# Patient Record
Sex: Male | Born: 1945 | Race: White | Hispanic: No | Marital: Married | Smoking: Never smoker
Health system: Southern US, Community
[De-identification: ages and names within clinical notes are randomized; demographics above are authoritative.]

## PROBLEM LIST (undated history)

## (undated) DIAGNOSIS — H35039 Hypertensive retinopathy, unspecified eye: Secondary | ICD-10-CM

## (undated) DIAGNOSIS — R7303 Prediabetes: Secondary | ICD-10-CM

## (undated) DIAGNOSIS — I639 Cerebral infarction, unspecified: Secondary | ICD-10-CM

## (undated) DIAGNOSIS — I10 Essential (primary) hypertension: Secondary | ICD-10-CM

## (undated) DIAGNOSIS — C61 Malignant neoplasm of prostate: Secondary | ICD-10-CM

## (undated) DIAGNOSIS — E7800 Pure hypercholesterolemia, unspecified: Secondary | ICD-10-CM

## (undated) DIAGNOSIS — I6529 Occlusion and stenosis of unspecified carotid artery: Secondary | ICD-10-CM

## (undated) HISTORY — DX: Hypertensive retinopathy, unspecified eye: H35.039

## (undated) HISTORY — PX: CAROTID ENDARTERECTOMY: SUR193

## (undated) HISTORY — PX: EYE SURGERY: SHX253

## (undated) HISTORY — PX: CATARACT EXTRACTION: SUR2

## (undated) HISTORY — DX: Occlusion and stenosis of unspecified carotid artery: I65.29

## (undated) HISTORY — PX: CHOLECYSTECTOMY: SHX55

---

## 2011-01-30 DIAGNOSIS — E785 Hyperlipidemia, unspecified: Secondary | ICD-10-CM | POA: Diagnosis not present

## 2011-01-30 DIAGNOSIS — Z125 Encounter for screening for malignant neoplasm of prostate: Secondary | ICD-10-CM | POA: Diagnosis not present

## 2011-02-01 DIAGNOSIS — E785 Hyperlipidemia, unspecified: Secondary | ICD-10-CM | POA: Diagnosis not present

## 2011-02-01 DIAGNOSIS — Z0000 Encounter for general adult medical examination without abnormal findings: Secondary | ICD-10-CM | POA: Diagnosis not present

## 2011-02-13 DIAGNOSIS — J042 Acute laryngotracheitis: Secondary | ICD-10-CM | POA: Diagnosis not present

## 2011-02-20 DIAGNOSIS — J309 Allergic rhinitis, unspecified: Secondary | ICD-10-CM | POA: Diagnosis not present

## 2011-04-19 DIAGNOSIS — R509 Fever, unspecified: Secondary | ICD-10-CM | POA: Diagnosis not present

## 2011-04-19 DIAGNOSIS — R05 Cough: Secondary | ICD-10-CM | POA: Diagnosis not present

## 2011-04-19 DIAGNOSIS — R059 Cough, unspecified: Secondary | ICD-10-CM | POA: Diagnosis not present

## 2011-04-19 DIAGNOSIS — J209 Acute bronchitis, unspecified: Secondary | ICD-10-CM | POA: Diagnosis not present

## 2011-04-27 DIAGNOSIS — J209 Acute bronchitis, unspecified: Secondary | ICD-10-CM | POA: Diagnosis not present

## 2011-04-27 DIAGNOSIS — J019 Acute sinusitis, unspecified: Secondary | ICD-10-CM | POA: Diagnosis not present

## 2011-07-19 DIAGNOSIS — E785 Hyperlipidemia, unspecified: Secondary | ICD-10-CM | POA: Diagnosis not present

## 2011-10-24 DIAGNOSIS — Z23 Encounter for immunization: Secondary | ICD-10-CM | POA: Diagnosis not present

## 2011-11-28 DIAGNOSIS — Z09 Encounter for follow-up examination after completed treatment for conditions other than malignant neoplasm: Secondary | ICD-10-CM | POA: Diagnosis not present

## 2012-01-05 DIAGNOSIS — Z125 Encounter for screening for malignant neoplasm of prostate: Secondary | ICD-10-CM | POA: Diagnosis not present

## 2012-01-05 DIAGNOSIS — I798 Other disorders of arteries, arterioles and capillaries in diseases classified elsewhere: Secondary | ICD-10-CM | POA: Diagnosis not present

## 2012-01-05 DIAGNOSIS — E785 Hyperlipidemia, unspecified: Secondary | ICD-10-CM | POA: Diagnosis not present

## 2012-06-19 DIAGNOSIS — I10 Essential (primary) hypertension: Secondary | ICD-10-CM | POA: Diagnosis not present

## 2012-06-19 DIAGNOSIS — E785 Hyperlipidemia, unspecified: Secondary | ICD-10-CM | POA: Diagnosis not present

## 2012-07-09 DIAGNOSIS — I6529 Occlusion and stenosis of unspecified carotid artery: Secondary | ICD-10-CM | POA: Diagnosis not present

## 2012-07-09 DIAGNOSIS — I63239 Cerebral infarction due to unspecified occlusion or stenosis of unspecified carotid arteries: Secondary | ICD-10-CM | POA: Diagnosis not present

## 2012-09-28 DIAGNOSIS — Z23 Encounter for immunization: Secondary | ICD-10-CM | POA: Diagnosis not present

## 2012-11-27 ENCOUNTER — Inpatient Hospital Stay (HOSPITAL_COMMUNITY)
Admission: EM | Admit: 2012-11-27 | Discharge: 2012-11-29 | DRG: 641 | Disposition: A | Discharge status: Home / Self Care | Payer: Medicare Other | Attending: Internal Medicine | Admitting: Internal Medicine

## 2012-11-27 ENCOUNTER — Emergency Department (HOSPITAL_COMMUNITY): Payer: Medicare Other

## 2012-11-27 ENCOUNTER — Encounter (HOSPITAL_COMMUNITY): Payer: Self-pay | Admitting: Emergency Medicine

## 2012-11-27 DIAGNOSIS — Z8673 Personal history of transient ischemic attack (TIA), and cerebral infarction without residual deficits: Secondary | ICD-10-CM

## 2012-11-27 DIAGNOSIS — F1020 Alcohol dependence, uncomplicated: Secondary | ICD-10-CM | POA: Diagnosis present

## 2012-11-27 DIAGNOSIS — I639 Cerebral infarction, unspecified: Secondary | ICD-10-CM | POA: Diagnosis present

## 2012-11-27 DIAGNOSIS — R030 Elevated blood-pressure reading, without diagnosis of hypertension: Secondary | ICD-10-CM | POA: Diagnosis not present

## 2012-11-27 DIAGNOSIS — Z79899 Other long term (current) drug therapy: Secondary | ICD-10-CM

## 2012-11-27 DIAGNOSIS — I10 Essential (primary) hypertension: Secondary | ICD-10-CM | POA: Diagnosis present

## 2012-11-27 DIAGNOSIS — E878 Other disorders of electrolyte and fluid balance, not elsewhere classified: Secondary | ICD-10-CM | POA: Diagnosis not present

## 2012-11-27 DIAGNOSIS — Z7982 Long term (current) use of aspirin: Secondary | ICD-10-CM | POA: Diagnosis not present

## 2012-11-27 DIAGNOSIS — R262 Difficulty in walking, not elsewhere classified: Secondary | ICD-10-CM | POA: Diagnosis present

## 2012-11-27 DIAGNOSIS — R42 Dizziness and giddiness: Secondary | ICD-10-CM | POA: Diagnosis not present

## 2012-11-27 DIAGNOSIS — IMO0001 Reserved for inherently not codable concepts without codable children: Secondary | ICD-10-CM | POA: Diagnosis present

## 2012-11-27 DIAGNOSIS — G459 Transient cerebral ischemic attack, unspecified: Secondary | ICD-10-CM | POA: Diagnosis not present

## 2012-11-27 DIAGNOSIS — Z7289 Other problems related to lifestyle: Secondary | ICD-10-CM

## 2012-11-27 DIAGNOSIS — I669 Occlusion and stenosis of unspecified cerebral artery: Secondary | ICD-10-CM | POA: Diagnosis not present

## 2012-11-27 DIAGNOSIS — E785 Hyperlipidemia, unspecified: Secondary | ICD-10-CM

## 2012-11-27 HISTORY — DX: Cerebral infarction, unspecified: I63.9

## 2012-11-27 HISTORY — DX: Pure hypercholesterolemia, unspecified: E78.00

## 2012-11-27 LAB — BASIC METABOLIC PANEL
BUN: 16 mg/dL (ref 6–23)
CO2: 23 mEq/L (ref 19–32)
Calcium: 9.3 mg/dL (ref 8.4–10.5)
Chloride: 105 mEq/L (ref 96–112)
Creatinine, Ser: 0.82 mg/dL (ref 0.50–1.35)
GFR calc Af Amer: >90 mL/min (ref >90)
GFR calc non Af Amer: 89 mL/min — ABNORMAL LOW (ref >90)
Glucose, Bld: 135 mg/dL — ABNORMAL HIGH (ref 70–99)
Potassium: 4 mEq/L (ref 3.5–5.1)
Sodium: 141 mEq/L (ref 135–145)

## 2012-11-27 LAB — CBC
HCT: 47.5 % (ref 39.0–52.0)
Hemoglobin: 17.1 g/dL — ABNORMAL HIGH (ref 13.0–17.0)
MCH: 32.9 pg (ref 26.0–34.0)
MCHC: 36 g/dL (ref 30.0–36.0)
MCV: 91.5 fL (ref 78.0–100.0)
Platelets: 137 10*3/uL — ABNORMAL LOW (ref 150–400)
RBC: 5.19 MIL/uL (ref 4.22–5.81)
RDW: 12.7 % (ref 11.5–15.5)
WBC: 6.2 10*3/uL (ref 4.0–10.5)

## 2012-11-27 LAB — GLUCOSE, CAPILLARY: Glucose-Capillary: 131 mg/dL — ABNORMAL HIGH (ref 70–99)

## 2012-11-27 LAB — POCT I-STAT TROPONIN I: Troponin i, poc: 0 ng/mL (ref 0.00–0.08)

## 2012-11-27 MED ORDER — LORATADINE 10 MG PO TABS
10.0000 mg | ORAL_TABLET | Freq: Every day | ORAL | Status: DC
  Administered 2012-11-28 – 2012-11-29 (×2): 10 mg via ORAL
  Filled 2012-11-27 (×2): qty 1

## 2012-11-27 MED ORDER — LORAZEPAM 1 MG PO TABS
0.0000 mg | ORAL_TABLET | Freq: Four times a day (QID) | ORAL | Status: DC
  Administered 2012-11-28: 4 mg via ORAL
  Filled 2012-11-27: qty 4

## 2012-11-27 MED ORDER — HYDRALAZINE HCL 20 MG/ML IJ SOLN: 10.0000 mg | INTRAMUSCULAR | Status: DC | PRN

## 2012-11-27 MED ORDER — SIMVASTATIN 20 MG PO TABS
20.0000 mg | ORAL_TABLET | Freq: Every day | ORAL | Status: DC
  Administered 2012-11-28 (×2): 20 mg via ORAL
  Filled 2012-11-27 (×4): qty 1

## 2012-11-27 MED ORDER — LORAZEPAM 1 MG PO TABS: 0.0000 mg | ORAL_TABLET | Freq: Two times a day (BID) | ORAL | Status: DC

## 2012-11-27 MED ORDER — LORAZEPAM 1 MG PO TABS: 1.0000 mg | ORAL_TABLET | Freq: Four times a day (QID) | ORAL | Status: DC | PRN

## 2012-11-27 MED ORDER — ADULT MULTIVITAMIN W/MINERALS CH
1.0000 | ORAL_TABLET | Freq: Every day | ORAL | Status: DC
  Administered 2012-11-28 – 2012-11-29 (×2): 1 via ORAL
  Filled 2012-11-27 (×2): qty 1

## 2012-11-27 MED ORDER — ASPIRIN EC 325 MG PO TBEC
325.0000 mg | DELAYED_RELEASE_TABLET | Freq: Every day | ORAL | Status: DC
  Administered 2012-11-28 – 2012-11-29 (×2): 325 mg via ORAL
  Filled 2012-11-27 (×2): qty 1

## 2012-11-27 MED ORDER — LORAZEPAM 2 MG/ML IJ SOLN: 1.0000 mg | Freq: Four times a day (QID) | INTRAMUSCULAR | Status: DC | PRN

## 2012-11-27 MED ORDER — ASPIRIN 300 MG RE SUPP
300.0000 mg | Freq: Every day | RECTAL | Status: DC
  Filled 2012-11-27 (×2): qty 1

## 2012-11-27 MED ORDER — THIAMINE HCL 100 MG/ML IJ SOLN
100.0000 mg | Freq: Every day | INTRAMUSCULAR | Status: DC
  Filled 2012-11-27 (×2): qty 1

## 2012-11-27 MED ORDER — ASPIRIN 325 MG PO TABS: 325.0000 mg | ORAL_TABLET | Freq: Every day | ORAL | Status: DC

## 2012-11-27 MED ORDER — VITAMIN B-1 100 MG PO TABS
100.0000 mg | ORAL_TABLET | Freq: Every day | ORAL | Status: DC
  Administered 2012-11-28 – 2012-11-29 (×2): 100 mg via ORAL
  Filled 2012-11-27 (×2): qty 1

## 2012-11-27 MED ORDER — SENNOSIDES-DOCUSATE SODIUM 8.6-50 MG PO TABS: 1.0000 | ORAL_TABLET | Freq: Every evening | ORAL | Status: DC | PRN

## 2012-11-27 MED ORDER — ENOXAPARIN SODIUM 40 MG/0.4ML ~~LOC~~ SOLN
40.0000 mg | SUBCUTANEOUS | Status: DC
  Administered 2012-11-28 (×2): 40 mg via SUBCUTANEOUS
  Filled 2012-11-27 (×3): qty 0.4

## 2012-11-27 MED ORDER — FOLIC ACID 1 MG PO TABS
1.0000 mg | ORAL_TABLET | Freq: Every day | ORAL | Status: DC
  Administered 2012-11-28 – 2012-11-29 (×2): 1 mg via ORAL
  Filled 2012-11-27 (×2): qty 1

## 2012-11-27 NOTE — H&P (Signed)
[facility] History and Physical  Jesse Goodwin ZOX:[id number] DOB: 1945-07-23 DOA: 11/27/2012  Referring physician: ER physician. PCP: No PCP Per Patient patient recently moved to [place].  Chief Complaint: Disequilibrium.  HPI: Jesse Goodwin is a 67 y.o. male with known history of stroke and hyperlipidemia status post right-sided carotid endarterectomy has been experiencing some difficulty walking since morning due to balance issues. Later he went to sleep and woke up afternoon after which he still had some difficulty walking and had come to the ER. By the time patient reached ER his symptoms have largely resolved and CT head did not show any acute but did show age-indeterminate frontal lobe lateral infarct and patient has been made for further management. Patient denies any weakness of the upper extremities denies any blurred vision difficulty speaking or swallowing. Denies any chest pain or shortness of breath.  Review of Systems: As presented in the history of presenting illness, rest negative.  Past Medical History  Diagnosis Date  . Stroke   . Hypercholesteremia    Past Surgical History  Procedure Laterality Date  . Carotid endarterectomy    . Cholecystectomy     Social History:  reports that he has never smoked. He does not have any smokeless tobacco history on file. He reports that he drinks alcohol. He reports that he does not use illicit drugs. Where does patient live home. Can patient participate in ADLs? Yes.  No Known Allergies  Family History:  Family History  Problem Relation Age of Onset  . Stroke Mother   . Breast cancer Mother   . Dementia Mother   . CAD Mother   . Diabetes Mellitus II Mother       Prior to Admission medications   Medication Sig Start Date End Date Taking? Authorizing Provider  aspirin 325 MG EC tablet Take 325 mg by mouth daily.   Yes Historical Provider, MD  cetirizine (ZYRTEC) 10 MG tablet Take 10 mg by mouth daily.   Yes  Historical Provider, MD  simvastatin (ZOCOR) 20 MG tablet Take 20 mg by mouth at bedtime.   Yes Historical Provider, MD  Tetrahydrozoline HCl (VISINE OP) Place 2 drops into both eyes daily as needed (dry eye).   Yes Historical Provider, MD    Physical Exam: Filed Vitals:   11/27/12 1900 11/27/12 1915 11/27/12 1945 11/27/12 2011  BP: 178/75 179/104 170/85 156/83  Pulse: 54 55 54 54  Temp:      TempSrc:      Resp: 13 12  20   SpO2: 99% 99% 100% 99%     General:  Well-developed well-nourished.  Eyes: Anicteric no pallor.  ENT: No discharge from the ears eyes nose mouth.  Neck: No mass felt.  Cardiovascular: S1-S2 heard.  Respiratory: No rhonchi or crepitations.  Abdomen: Soft nontender bowel sounds present.  Skin: No rash.  Musculoskeletal: No edema.  Psychiatric: Appears normal.  Neurologic: Alert awake oriented to time place and person. Moves all extremities 5 x 5. No facial asymmetry. Tongue is midline.  Labs on Admission:  Basic Metabolic Panel:  Recent Labs Lab 11/27/12 1545  NA 141  K 4.0  CL 105  CO2 23  GLUCOSE 135*  BUN 16  CREATININE 0.82  CALCIUM 9.3   Liver Function Tests: No results found for this basename: AST, ALT, ALKPHOS, BILITOT, PROT, ALBUMIN,  in the last 168 hours No results found for this basename: LIPASE, AMYLASE,  in the last 168 hours No results found for this basename: AMMONIA,  in the last 168 hours CBC:  Recent Labs Lab 11/27/12 1545  WBC 6.2  HGB 17.1*  HCT 47.5  MCV 91.5  PLT 137*   Cardiac Enzymes: No results found for this basename: CKTOTAL, CKMB, CKMBINDEX, TROPONINI,  in the last 168 hours  BNP (last 3 results) No results found for this basename: PROBNP,  in the last 8760 hours CBG:  Recent Labs Lab 11/27/12 1544  GLUCAP 131*    Radiological Exams on Admission: Ct Head Wo Contrast  11/27/2012   CLINICAL DATA:  Dizziness worse with standing and lightheadedness beginning this morning. History of stroke.   EXAM: CT HEAD WITHOUT CONTRAST  TECHNIQUE: Contiguous axial images were obtained from the base of the skull through the vertex without intravenous contrast.  COMPARISON:  None.  FINDINGS: The ventricles are prominent for age, suggestive of mildly advanced cerebral atrophy. There is a 1 cm round focus of hypoattenuation within the right frontal white matter (series 2, image 20). Periventricular white matter hypoattenuation is suggestive of mild chronic small vessel ischemic disease. There is no evidence of acute cortical infarct, mass, midline shift, intracranial hemorrhage, or extra-axial collection. The orbits are unremarkable. The mastoid air cells are clear. There is partial opacification of multiple ethmoid air cells bilaterally. Mucosal thickening is partially visualized in the alveolar recess of the left maxillary sinus.  IMPRESSION: 1. Small low-density focus in the right frontal white matter, nonspecific but may represent a lacunar infarct of indeterminate age. 2. No evidence of acute cortical infarct or intracranial hemorrhage.   Electronically Signed   By: Sebastian Ache   On: 11/27/2012 20:17    EKG: Independently reviewed. Normal sinus rhythm.  Assessment/Plan Principal Problem:   CVA (cerebral infarction) Active Problems:   Elevated blood pressure   Hyperlipidemia   1. Possible CVA - at this time neurologist has recommended to get MRI brain and if positive to pursue further stroke workup. Continue aspirin. 2. Elevated blood pressure - closely follow blood pressure trends. Since patient may have acute stroke at this time permissive hypertension. Eventually patient may need antihypertensives if patient is having consistently high blood pressure. Patient presently is on when necessary IV hydralazine for systolic blood pressure more than 220 and diastolic more than 120. 3. Hyperlipidemia - continue statins.    Code Status: Full code.  Family Communication: Family at the bedside.   Disposition Plan: Admit to inpatient.    KAKRAKANDY,ARSHAD N. [facility] Pager [phone].  If 7PM-7AM, please contact night-coverage [email or web address] Password [facility] 11/27/2012, 9:29 PM

## 2012-11-27 NOTE — Progress Notes (Signed)
Received pt from ED via stretcher. Pt alert, oriented and not in any acute distress. Assessed pt. Vital signs checked and recorded. Will continue to monitor pt.

## 2012-11-27 NOTE — ED Notes (Signed)
Pt verbalizes he woke up this morning and felt dizzy and lethargic. Pt verbalizes he still feels dizzy and took his BP at home and verbalizes it was elevated. Pt denies Hx of HTN.

## 2012-11-27 NOTE — Progress Notes (Signed)
Receiving report from ED. Report given by Jesusita Oka, RN.

## 2012-11-27 NOTE — ED Notes (Signed)
CBG is 131. Notified Nurse Shanda Bumps.

## 2012-11-27 NOTE — ED Provider Notes (Signed)
CSN: [id number]     Arrival date & time 11/27/12  1526 History   First MD Initiated Contact with Patient 11/27/12 1847     Chief Complaint  Patient presents with  . Dizziness  . Hypertension    The history is provided by the patient and a relative.  pt presents for dizziness He reports that after waking this morning, he felt "off" and felt dizzy with some difficulty walking, improved with rest No HA No focal weakness No visual/hearing loss No cp/sob No abd pain  He is now feeling improved but with some residual symptoms He has h/o CVA He has h/o HTN   H/o right carotid surgery previously Denies recent CVA workup   Past Medical History  Diagnosis Date  . Stroke   . Hypercholesteremia    History reviewed. No pertinent past surgical history. History reviewed. No pertinent family history. History  Substance Use Topics  . Smoking status: Never Smoker   . Smokeless tobacco: Not on file  . Alcohol Use: Yes    Review of Systems  Constitutional: Negative for fever.  Respiratory: Negative for shortness of breath.   Cardiovascular: Negative for chest pain.  Neurological: Positive for dizziness.  All other systems reviewed and are negative.    Allergies  Review of patient's allergies indicates no known allergies.  Home Medications   Current Outpatient Rx  Name  Route  Sig  Dispense  Refill  . aspirin 325 MG EC tablet   Oral   Take 325 mg by mouth daily.         . cetirizine (ZYRTEC) 10 MG tablet   Oral   Take 10 mg by mouth daily.         . simvastatin (ZOCOR) 20 MG tablet   Oral   Take 20 mg by mouth at bedtime.         . Tetrahydrozoline HCl (VISINE OP)   Both Eyes   Place 2 drops into both eyes daily as needed (dry eye).          BP 156/83  Pulse 54  Temp(Src) 97.8 F (36.6 C) (Oral)  Resp 20  SpO2 99% Physical Exam CONSTITUTIONAL: Well developed/well nourished HEAD: Normocephalic/atraumatic EYES: EOMI/PERRL ENMT: Mucous membranes  moist NECK: supple no meningeal signs, scar noted to right neck, no bruits SPINE:entire spine nontender CV: S1/S2 noted, no murmurs/rubs/gallops noted LUNGS: Lungs are clear to auscultation bilaterally, no apparent distress ABDOMEN: soft, nontender, no rebound or guarding GU:no cva tenderness NEURO: Pt is awake/alert, moves all extremitiesx4 No arm/leg drift.  No facial droop is noted.  No past pointing No ataxia noted EXTREMITIES: pulses normal, full ROM SKIN: warm, color normal PSYCH: no abnormalities of mood noted  ED Course  Procedures   tPA in stroke considered but not given due to:  Onset over 3-4.5hours Symptoms improving  8:42 PM D/w dr Toniann Fail, will admit for TIA workup    Labs Review Labs Reviewed  CBC - Abnormal; Notable for the following:    Hemoglobin 17.1 (*)    Platelets 137 (*)    All other components within normal limits  BASIC METABOLIC PANEL - Abnormal; Notable for the following:    Glucose, Bld 135 (*)    GFR calc non Af Amer 89 (*)    All other components within normal limits  GLUCOSE, CAPILLARY - Abnormal; Notable for the following:    Glucose-Capillary 131 (*)    All other components within normal limits  POCT I-STAT TROPONIN I  Imaging Review Ct Head Wo Contrast  11/27/2012   CLINICAL DATA:  Dizziness worse with standing and lightheadedness beginning this morning. History of stroke.  EXAM: CT HEAD WITHOUT CONTRAST  TECHNIQUE: Contiguous axial images were obtained from the base of the skull through the vertex without intravenous contrast.  COMPARISON:  None.  FINDINGS: The ventricles are prominent for age, suggestive of mildly advanced cerebral atrophy. There is a 1 cm round focus of hypoattenuation within the right frontal white matter (series 2, image 20). Periventricular white matter hypoattenuation is suggestive of mild chronic small vessel ischemic disease. There is no evidence of acute cortical infarct, mass, midline shift, intracranial  hemorrhage, or extra-axial collection. The orbits are unremarkable. The mastoid air cells are clear. There is partial opacification of multiple ethmoid air cells bilaterally. Mucosal thickening is partially visualized in the alveolar recess of the left maxillary sinus.  IMPRESSION: 1. Small low-density focus in the right frontal white matter, nonspecific but may represent a lacunar infarct of indeterminate age. 2. No evidence of acute cortical infarct or intracranial hemorrhage.   Electronically Signed   By: Sebastian Ache   On: 11/27/2012 20:17    EKG Interpretation     Ventricular Rate:  65 PR Interval:  164 QRS Duration: 86 QT Interval:  436 QTC Calculation: 453 R Axis:   16 Text Interpretation:  Normal sinus rhythm Possible Left atrial enlargement Borderline ECG No previous ECGs available            MDM  No diagnosis found. Nursing notes including past medical history and social history reviewed and considered in documentation Labs/vital reviewed and considered     Joya Gaskins, MD 11/27/12 2043

## 2012-11-27 NOTE — ED Notes (Signed)
MD at bedside. 

## 2012-11-27 NOTE — ED Notes (Signed)
Patient transported to CT 

## 2012-11-27 NOTE — ED Notes (Signed)
Pt c/o upon waking this am some dizziness worse with standing and lightheadedness; pt sts noted htn without hx of same; pt sts hx of CVA in past

## 2012-11-28 ENCOUNTER — Inpatient Hospital Stay (HOSPITAL_COMMUNITY): Payer: Medicare Other

## 2012-11-28 DIAGNOSIS — I10 Essential (primary) hypertension: Secondary | ICD-10-CM | POA: Diagnosis present

## 2012-11-28 DIAGNOSIS — R030 Elevated blood-pressure reading, without diagnosis of hypertension: Secondary | ICD-10-CM

## 2012-11-28 DIAGNOSIS — I6350 Cerebral infarction due to unspecified occlusion or stenosis of unspecified cerebral artery: Secondary | ICD-10-CM

## 2012-11-28 DIAGNOSIS — F1010 Alcohol abuse, uncomplicated: Secondary | ICD-10-CM

## 2012-11-28 DIAGNOSIS — E785 Hyperlipidemia, unspecified: Secondary | ICD-10-CM

## 2012-11-28 LAB — CBC WITH DIFFERENTIAL/PLATELET
Basophils Absolute: 0 10*3/uL (ref 0.0–0.1)
Basophils Relative: 1 % (ref 0–1)
Eosinophils Absolute: 0.3 10*3/uL (ref 0.0–0.7)
Eosinophils Relative: 4 % (ref 0–5)
HCT: 41.5 % (ref 39.0–52.0)
Hemoglobin: 14.6 g/dL (ref 13.0–17.0)
Lymphocytes Relative: 29 % (ref 12–46)
Lymphs Abs: 1.7 10*3/uL (ref 0.7–4.0)
MCH: 32.2 pg (ref 26.0–34.0)
MCHC: 35.2 g/dL (ref 30.0–36.0)
MCV: 91.4 fL (ref 78.0–100.0)
Monocytes Absolute: 0.8 10*3/uL (ref 0.1–1.0)
Monocytes Relative: 13 % — ABNORMAL HIGH (ref 3–12)
Neutro Abs: 3.1 10*3/uL (ref 1.7–7.7)
Neutrophils Relative %: 53 % (ref 43–77)
Platelets: 162 10*3/uL (ref 150–400)
RBC: 4.54 MIL/uL (ref 4.22–5.81)
RDW: 13.1 % (ref 11.5–15.5)
WBC: 5.9 10*3/uL (ref 4.0–10.5)

## 2012-11-28 LAB — COMPREHENSIVE METABOLIC PANEL
ALT: 39 U/L (ref 0–53)
AST: 23 U/L (ref 0–37)
Albumin: 3.5 g/dL (ref 3.5–5.2)
Alkaline Phosphatase: 41 U/L (ref 39–117)
BUN: 15 mg/dL (ref 6–23)
CO2: 27 mEq/L (ref 19–32)
Calcium: 8.7 mg/dL (ref 8.4–10.5)
Chloride: 104 mEq/L (ref 96–112)
Creatinine, Ser: 0.89 mg/dL (ref 0.50–1.35)
GFR calc Af Amer: >90 mL/min (ref >90)
GFR calc non Af Amer: 87 mL/min — ABNORMAL LOW (ref >90)
Glucose, Bld: 144 mg/dL — ABNORMAL HIGH (ref 70–99)
Potassium: 3.3 mEq/L — ABNORMAL LOW (ref 3.5–5.1)
Sodium: 141 mEq/L (ref 135–145)
Total Bilirubin: 0.3 mg/dL (ref 0.3–1.2)
Total Protein: 6.5 g/dL (ref 6.0–8.3)

## 2012-11-28 LAB — HEMOGLOBIN A1C
Hgb A1c MFr Bld: 5.8 % — ABNORMAL HIGH (ref <5.7)
Mean Plasma Glucose: 120 mg/dL — ABNORMAL HIGH (ref <117)

## 2012-11-28 LAB — LIPID PANEL
Cholesterol: 174 mg/dL (ref 0–200)
HDL: 64 mg/dL (ref >39)
LDL Cholesterol: 85 mg/dL (ref 0–99)
Total CHOL/HDL Ratio: 2.7 RATIO
Triglycerides: 127 mg/dL (ref <150)
VLDL: 25 mg/dL (ref 0–40)

## 2012-11-28 MED ORDER — LISINOPRIL 5 MG PO TABS
5.0000 mg | ORAL_TABLET | Freq: Every day | ORAL | Status: DC
  Administered 2012-11-28 – 2012-11-29 (×2): 5 mg via ORAL
  Filled 2012-11-28 (×2): qty 1

## 2012-11-28 NOTE — Progress Notes (Signed)
CM CONSULT Talked to patient with spouse and daughter present about discharge planning; patient is new to this area and needs a primary care physician; Patient informed CM that he plans to follow up with Dr Azucena Cecil with [facility] after discharge; pharmacy of choice is [facility] and does not have any problems getting his medication/ prescriptions filled; very supportive family; Alexis Goodell [phone]

## 2012-11-28 NOTE — Evaluation (Signed)
Physical Therapy Evaluation Patient Details Name: Jesse Goodwin MRN: [id number] DOB: [birth year] Today's Date: 11/28/2012 Time: 4098-1191 PT Time Calculation (min): 13 min  PT Assessment / Plan / Recommendation History of Present Illness  Jesse Goodwin is a 67 y.o. 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 He describes that initially awakening, he stood up to walk and felt himself to be very unsteady, and therefore laid back down and slept. Later in the day, on awakening, he continued to have disequilibrium and lightheadedness, but denies any true vertigo.  Clinical Impression  Pt is independent with all mobility and does not have any further PT needs at this time.    PT Assessment  Patent does not need any further PT services    Follow Up Recommendations  No PT follow up    Does the patient have the potential to tolerate intense rehabilitation      Barriers to Discharge        Equipment Recommendations  None recommended by PT    Recommendations for Other Services     Frequency      Precautions / Restrictions Precautions Precautions: None Restrictions Weight Bearing Restrictions: No   Pertinent Vitals/Pain No c/o pain      Mobility  Bed Mobility Bed Mobility: Supine to Sit;Sit to Supine Supine to Sit: 7: Independent Sit to Supine: 7: Independent Transfers Transfers: Sit to Stand;Stand to Sit Sit to Stand: 7: Independent Stand to Sit: 7: Independent Ambulation/Gait Ambulation/Gait Assistance: 7: Independent Ambulation Distance (Feet): 400 Feet Assistive device: None Ambulation/Gait Assistance Details: normal cadence and gait pattern Stairs: Yes Stairs Assistance: 7: Independent Stair Management Technique: No rails Number of Stairs: 5    Exercises     PT Diagnosis:    PT Problem List:   PT Treatment Interventions:       PT Goals(Current goals can be found in the care  plan section) Acute Rehab PT Goals PT Goal Formulation: No goals set, d/c therapy  Visit Information  Last PT Received On: 11/28/12 Assistance Needed: +1 History of Present Illness: Jesse Goodwin is a 67 y.o. 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 He describes that initially awakening, he stood up to walk and felt himself to be very unsteady, and therefore laid back down and slept. Later in the day, on awakening, he continued to have disequilibrium and lightheadedness, but denies any true vertigo.       Prior Functioning  Home Living Family/patient expects to be discharged to:: Private residence Living Arrangements: Spouse/significant other Available Help at Discharge: Family Type of Home: House Home Access: Stairs to enter Entrance Stairs-Rails: None Home Layout: One level Home Equipment: None Prior Function Level of Independence: Independent Communication Communication: No difficulties    Cognition  Cognition Arousal/Alertness: Awake/alert Behavior During Therapy: WFL for tasks assessed/performed Overall Cognitive Status: Within Functional Limits for tasks assessed    Extremity/Trunk Assessment Lower Extremity Assessment Lower Extremity Assessment: Overall WFL for tasks assessed Cervical / Trunk Assessment Cervical / Trunk Assessment: Normal   Balance    End of Session PT - End of Session Equipment Utilized During Treatment: Gait belt Activity Tolerance: Patient tolerated treatment well Patient left: in bed;with call bell/phone within reach Nurse Communication: Mobility status  GP Functional Assessment Tool Used: clinical judgement Functional Limitation: Mobility: Walking and moving around Mobility: Walking and Moving Around Current Status (Y7829): 0 percent  impaired, limited or restricted Mobility: Walking and Moving Around Goal Status [phone]): 0 percent impaired, limited or  restricted Mobility: Walking and Moving Around Discharge Status [phone]): 0 percent impaired, limited or restricted   DONAWERTH,KAREN 11/28/2012, 8:43 AM

## 2012-11-28 NOTE — Progress Notes (Signed)
Pt. Family has requested to speak with MD.  MD was paged twice and notified by student RN earlier about it.  MD specified he is busy with three new admissions and has to round on other patients.  MD stated he will be up to see patient and family when he is able to.

## 2012-11-28 NOTE — Progress Notes (Signed)
[facility] PROGRESS NOTE  Jesse Goodwin ZOX:[id number] DOB: Mar 28, 1945 DOA: 11/27/2012 PCP: No PCP Per Patient  Assessment/Plan: 1. Stroke like symptoms - workup negative for new stroke - Given elevated blood pressures we'll place on ACE inhibitor - continue statin  2 hypertension - As mentioned above I. ACE inhibitor - Will continue to monitor blood pressures - Reassess creatinine next a.m.  3 habitual alcohol use - patient reportedly only drinks one to 2 drinks a day and does not report any anxiety if he does not drink daily. Apparently patient was very sleepy on Ativan as such we'll discontinue Ativan  4. Hyperlipidemia - Continue statin   Code Status: full code Family Communication: very lengthy discussion with wife and daughter more than 40 minute Disposition Plan: if blood pressures improved and creatinine were within normal limits may consider discharge 11/29/2012   Consultants:  neurology  Procedures:  MRI of brain  CT of head  Antibiotics:  none  HPI/Subjective: No new complaint. No acute issues overnight  Objective: Filed Vitals:   11/28/12 1800  BP: 137/71  Pulse: 72  Temp: 97.6 F (36.4 C)  Resp: 18    Intake/Output Summary (Last 24 hours) at 11/28/12 1939 Last data filed at 11/28/12 1700  Gross per 24 hour  Intake    360 ml  Output      0 ml  Net    360 ml   Filed Weights   11/27/12 2327  Weight: 86.592 kg (190 lb 14.4 oz)    Exam:   General:  Patient in no acute distress  Cardiovascular: regular rate and rhythm, no murmurs  Respiratory: clear to auscultation bilaterally, no increased work of breathing  Abdomen: soft nontender nondistended  Musculoskeletal: no cyanosis or clubbing  Data Reviewed: Basic Metabolic Panel:  Recent Labs Lab 11/27/12 1545 11/28/12 0040  NA 141 141  K 4.0 3.3*  CL 105 104  CO2 23 27  GLUCOSE 135* 144*  BUN 16 15  CREATININE 0.82 0.89  CALCIUM 9.3 8.7   Liver Function  Tests:  Recent Labs Lab 11/28/12 0040  AST 23  ALT 39  ALKPHOS 41  BILITOT 0.3  PROT 6.5  ALBUMIN 3.5   No results found for this basename: LIPASE, AMYLASE,  in the last 168 hours No results found for this basename: AMMONIA,  in the last 168 hours CBC:  Recent Labs Lab 11/27/12 1545 11/28/12 0040  WBC 6.2 5.9  NEUTROABS  --  3.1  HGB 17.1* 14.6  HCT 47.5 41.5  MCV 91.5 91.4  PLT 137* 162   Cardiac Enzymes: No results found for this basename: CKTOTAL, CKMB, CKMBINDEX, TROPONINI,  in the last 168 hours BNP (last 3 results) No results found for this basename: PROBNP,  in the last 8760 hours CBG:  Recent Labs Lab 11/27/12 1544  GLUCAP 131*    No results found for this or any previous visit (from the past 240 hour(s)).   Studies: Dg Chest 2 View  11/28/2012   CLINICAL DATA:  Stroke.  EXAM: CHEST  2 VIEW  COMPARISON:  None.  FINDINGS: Heart size and pulmonary vascularity are normal and the lungs are clear except for a tiny area of linear atelectasis or scarring low at the right lung base posteriorly. No significant osseous abnormality.  IMPRESSION: No significant abnormality.   Electronically Signed   By: Geanie Cooley M.D.   On: 11/28/2012 13:10   Ct Head Wo Contrast  11/27/2012   CLINICAL DATA:  Dizziness worse  with standing and lightheadedness beginning this morning. History of stroke.  EXAM: CT HEAD WITHOUT CONTRAST  TECHNIQUE: Contiguous axial images were obtained from the base of the skull through the vertex without intravenous contrast.  COMPARISON:  None.  FINDINGS: The ventricles are prominent for age, suggestive of mildly advanced cerebral atrophy. There is a 1 cm round focus of hypoattenuation within the right frontal white matter (series 2, image 20). Periventricular white matter hypoattenuation is suggestive of mild chronic small vessel ischemic disease. There is no evidence of acute cortical infarct, mass, midline shift, intracranial hemorrhage, or extra-axial  collection. The orbits are unremarkable. The mastoid air cells are clear. There is partial opacification of multiple ethmoid air cells bilaterally. Mucosal thickening is partially visualized in the alveolar recess of the left maxillary sinus.  IMPRESSION: 1. Small low-density focus in the right frontal white matter, nonspecific but may represent a lacunar infarct of indeterminate age. 2. No evidence of acute cortical infarct or intracranial hemorrhage.   Electronically Signed   By: Sebastian Ache   On: 11/27/2012 20:17   Mr Brain Wo Contrast  11/28/2012   CLINICAL DATA:  67 year old male with hyperlipidemia and history of prior stroke presenting with difficulty walking and balance issues. Symptoms largely resolved in emergency room. Prior carotid endarterectomy.  EXAM: MRI HEAD WITHOUT CONTRAST  TECHNIQUE: Multiplanar, multiecho pulse sequences of the brain and surrounding structures were obtained without intravenous contrast.  COMPARISON:  11/27/2012 CT.  No comparison MR.  FINDINGS: No acute infarct.  Scattered white matter type changes most notable right frontal lobe most consistent with result of small vessel disease in this hyperlipidemia patient.  No intracranial hemorrhage.  No intracranial mass lesion noted on this unenhanced exam.  Atrophy. Ventricular prominence most likely related to atrophy rather than hydrocephalus.  Major intracranial vascular structures are patent.  Paranasal sinus mucosal thickening.  Cervical medullary junction, pituitary region, pineal region and orbital structures unremarkable.  IMPRESSION: No acute infarct.  Mild small vessel disease type changes.  Atrophy.  Paranasal sinus mucosal thickening.  Please see above.   Electronically Signed   By: Bridgett Larsson M.D.   On: 11/28/2012 11:12    Scheduled Meds: . aspirin  325 mg Oral Daily  . aspirin  300 mg Rectal Daily  . enoxaparin (LOVENOX) injection  40 mg Subcutaneous Q24H  . folic acid  1 mg Oral Daily  . lisinopril  5 mg  Oral Daily  . loratadine  10 mg Oral Daily  . LORazepam  0-4 mg Oral Q6H   Followed by  . [START ON 11/29/2012] LORazepam  0-4 mg Oral Q12H  . multivitamin with minerals  1 tablet Oral Daily  . simvastatin  20 mg Oral QHS  . thiamine  100 mg Oral Daily   Or  . thiamine  100 mg Intravenous Daily   Continuous Infusions:   Principal Problem: Stroke like symptoms   H/O CVA (cerebral infarction) Active Problems:   Elevated blood pressure   Hyperlipidemia HTN   Time spent:> 50 minutes and prolonged care due to many questions from wife and daughter which were answered to their satisfaction    Penny Pia  [facility] Pager [phone] If 7PM-7AM, please contact night-coverage at [email or web address], password [facility] 11/28/2012, 7:39 PM  LOS: 1 day

## 2012-11-28 NOTE — Consult Note (Signed)
Neurology Consultation Reason for Consult: Disequilibrium Referring Physician: Toniann Fail, A  CC: Disequilibrium  History is obtained from: Patient  HPI: Jesse Goodwin is a 67 y.o. 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 He describes that initially awakening, he stood up to walk and felt himself to be very unsteady, and therefore laid back down and slept. Later in the day, on awakening, he continued to have disequilibrium and lightheadedness, but denies any true vertigo.  He denies any nausea, vomiting, or other symptoms   LKW: Prior to bedtime, 11/11 tpa given?: no, outside of window    ROS: A 14 point ROS was performed and is negative except as noted in the HPI.  Past Medical History  Diagnosis Date  . Stroke   . Hypercholesteremia     Family History: Mother-heart disease  Social History: Tob: Negative  Exam: Current vital signs: BP 174/103  Pulse 56  Temp(Src) 97.8 F (36.6 C) (Axillary)  Resp 16  Ht 5\' 10"  (1.778 m)  Wt 86.592 kg (190 lb 14.4 oz)  BMI 27.39 kg/m2  SpO2 98% Vital signs in last 24 hours: Temp:  [97.8 F (36.6 C)-98.8 F (37.1 C)] 97.8 F (36.6 C) (11/13 0056) Pulse Rate:  [54-69] 56 (11/13 0056) Resp:  [12-20] 16 (11/13 0056) BP: (156-186)/(75-109) 174/103 mmHg (11/13 0056) SpO2:  [96 %-100 %] 98 % (11/13 0056) Weight:  [86.592 kg (190 lb 14.4 oz)] 86.592 kg (190 lb 14.4 oz) (11/12 2327)  General: In bed, NAD CV: Regular in rhythm Mental Status: Patient is awake, alert, oriented to person, place, month, year, and situation. Immediate and remote memory are intact. Patient is able to give a clear and coherent history. No signs of aphasia or neglect Cranial Nerves: II: Visual Fields are full. Pupils are equal, round, and reactive to light.  Discs are difficult to visualize. III,IV, VI: EOMI without ptosis or diploplia.  V: Facial sensation is  symmetric to temperature VII: Facial movement is symmetric.  VIII: hearing is intact to voice X: Uvula elevates symmetrically XI: Shoulder shrug is symmetric. XII: tongue is midline without atrophy or fasciculations.  Motor: Tone is normal. Bulk is normal. 5/5 strength was present in all four extremities.  Sensory: Sensation is symmetric to light touch and temperature in the arms and legs. Deep Tendon Reflexes: 2+ and symmetric in the biceps and patellae.  Plantars: Toes are downgoing bilaterally.  Cerebellar: FNF and HKS are intact bilaterally Gait: Not tested due to patient safety concerns   I have reviewed labs in epic and the results pertinent to this consultation are: CBC-unremarkable  I have reviewed the images obtained: CT head-hypodensity in the right frontal region  Impression: 67 year old male with disequilibrium of unclear etiology that is steadily improving. One possibility would be ischemic stroke, he will need an MRI to rule this out. If this is negative, since his symptoms are improving, I would not pursue further workup. I think that TIA is unlikely given the duration of symptoms.  Recommendations: 1) MRI brain, if negative, then no further workup 2) PT given continued symptoms.   Ritta Slot, MD [facility] [phone]  If 7pm- 7am, please page neurology on call at [phone].

## 2012-11-28 NOTE — Progress Notes (Signed)
SLP Cancellation Note  Patient Details Name: Jesse Goodwin MRN: [id number] DOB: Mar 08, 1945   Cancelled treatment:       Reason Eval/Treat Not Completed: SLP screened, no needs identified, will sign off   DeBlois, Riley Nearing 11/28/2012, 10:14 AM

## 2012-11-29 DIAGNOSIS — I10 Essential (primary) hypertension: Secondary | ICD-10-CM

## 2012-11-29 DIAGNOSIS — G459 Transient cerebral ischemic attack, unspecified: Secondary | ICD-10-CM

## 2012-11-29 DIAGNOSIS — F1020 Alcohol dependence, uncomplicated: Secondary | ICD-10-CM

## 2012-11-29 MED ORDER — LISINOPRIL 5 MG PO TABS: 5.0000 mg | ORAL_TABLET | Freq: Every day | ORAL | Status: DC

## 2012-11-29 NOTE — Progress Notes (Signed)
NEURO HOSPITALIST PROGRESS NOTE   SUBJECTIVE:                                                                                                                        No further complaints.    OBJECTIVE:                                                                                                                           Vital signs in last 24 hours: Temp:  [97.6 F (36.4 C)-98.3 F (36.8 C)] 98.3 F (36.8 C) (11/14 1021) Pulse Rate:  [56-72] 65 (11/14 1021) Resp:  [18] 18 (11/14 1021) BP: (108-172)/(69-92) 137/92 mmHg (11/14 1021) SpO2:  [94 %-98 %] 97 % (11/14 1021)  Intake/Output from previous day: 11/13 0701 - 11/14 0700 In: 360 [P.O.:360] Out: -  Intake/Output this shift: Total I/O In: 360 [P.O.:360] Out: -  Nutritional status: Cardiac  Past Medical History  Diagnosis Date  . Stroke   . Hypercholesteremia      Neurologic Exam:   Mental Status: Alert, oriented, thought content appropriate.  Speech fluent without evidence of aphasia.  Able to follow 3 step commands without difficulty. Cranial Nerves: II: Visual fields grossly normal, pupils equal, round, reactive to light and accommodation III,IV, VI: ptosis not present, extra-ocular motions intact bilaterally V,VII: smile symmetric, facial light touch sensation normal bilaterally VIII: hearing normal bilaterally IX,X: gag reflex present XI: bilateral shoulder shrug XII: midline tongue extension without atrophy or fasciculations  Motor: Right : Upper extremity   5/5    Left:     Upper extremity   5/5  Lower extremity   5/5     Lower extremity   5/5 Tone and bulk:normal tone throughout; no atrophy noted Sensory: Pinprick and light touch intact throughout, bilaterally Deep Tendon Reflexes:  Right: Upper Extremity   Left: Upper extremity   biceps (C-5 to C-6) 2/4   biceps (C-5 to C-6) 2/4 tricep (C7) 2/4    triceps (C7) 2/4 Brachioradialis (C6) 2/4  Brachioradialis (C6)  2/4  Lower Extremity Lower Extremity  quadriceps (L-2 to L-4) 2/4   quadriceps (L-2 to L-4) 2/4 Achilles (S1) 2/4   Achilles (S1) 2/4  Plantars: Right: downgoing   Left: downgoing  Cerebellar: normal finger-to-nose,  normal heel-to-shin test CV: pulses palpable throughout   Lab Results: Lab Results  Component Value Date/Time   CHOL 174 11/28/2012  4:20 AM   Lipid Panel  Recent Labs  11/28/12 0420  CHOL 174  TRIG 127  HDL 64  CHOLHDL 2.7  VLDL 25  LDLCALC 85    Studies/Results: Dg Chest 2 View  11/28/2012   CLINICAL DATA:  Stroke.  EXAM: CHEST  2 VIEW  COMPARISON:  None.  FINDINGS: Heart size and pulmonary vascularity are normal and the lungs are clear except for a tiny area of linear atelectasis or scarring low at the right lung base posteriorly. No significant osseous abnormality.  IMPRESSION: No significant abnormality.   Electronically Signed   By: Geanie Cooley M.D.   On: 11/28/2012 13:10   Ct Head Wo Contrast  11/27/2012   CLINICAL DATA:  Dizziness worse with standing and lightheadedness beginning this morning. History of stroke.  EXAM: CT HEAD WITHOUT CONTRAST  TECHNIQUE: Contiguous axial images were obtained from the base of the skull through the vertex without intravenous contrast.  COMPARISON:  None.  FINDINGS: The ventricles are prominent for age, suggestive of mildly advanced cerebral atrophy. There is a 1 cm round focus of hypoattenuation within the right frontal white matter (series 2, image 20). Periventricular white matter hypoattenuation is suggestive of mild chronic small vessel ischemic disease. There is no evidence of acute cortical infarct, mass, midline shift, intracranial hemorrhage, or extra-axial collection. The orbits are unremarkable. The mastoid air cells are clear. There is partial opacification of multiple ethmoid air cells bilaterally. Mucosal thickening is partially visualized in the alveolar recess of the left maxillary sinus.  IMPRESSION: 1. Small  low-density focus in the right frontal white matter, nonspecific but may represent a lacunar infarct of indeterminate age. 2. No evidence of acute cortical infarct or intracranial hemorrhage.   Electronically Signed   By: Sebastian Ache   On: 11/27/2012 20:17   Mr Brain Wo Contrast  11/28/2012   CLINICAL DATA:  67 year old male with hyperlipidemia and history of prior stroke presenting with difficulty walking and balance issues. Symptoms largely resolved in emergency room. Prior carotid endarterectomy.  EXAM: MRI HEAD WITHOUT CONTRAST  TECHNIQUE: Multiplanar, multiecho pulse sequences of the brain and surrounding structures were obtained without intravenous contrast.  COMPARISON:  11/27/2012 CT.  No comparison MR.  FINDINGS: No acute infarct.  Scattered white matter type changes most notable right frontal lobe most consistent with result of small vessel disease in this hyperlipidemia patient.  No intracranial hemorrhage.  No intracranial mass lesion noted on this unenhanced exam.  Atrophy. Ventricular prominence most likely related to atrophy rather than hydrocephalus.  Major intracranial vascular structures are patent.  Paranasal sinus mucosal thickening.  Cervical medullary junction, pituitary region, pineal region and orbital structures unremarkable.  IMPRESSION: No acute infarct.  Mild small vessel disease type changes.  Atrophy.  Paranasal sinus mucosal thickening.  Please see above.   Electronically Signed   By: Bridgett Larsson M.D.   On: 11/28/2012 11:12    MEDICATIONS  Scheduled: . aspirin  325 mg Oral Daily  . aspirin  300 mg Rectal Daily  . enoxaparin (LOVENOX) injection  40 mg Subcutaneous Q24H  . folic acid  1 mg Oral Daily  . lisinopril  5 mg Oral Daily  . loratadine  10 mg Oral Daily  . multivitamin with minerals  1 tablet Oral Daily  . simvastatin  20 mg Oral QHS  .  thiamine  100 mg Oral Daily   Or  . thiamine  100 mg Intravenous Daily    ASSESSMENT/PLAN:                                                                                                            67 YO male with transient disequilibrium which seems to be more with changing position and movement and better when he remained still.  Currently his symptoms have resolved.  MRI is negative for acute infarct. Would not recommend further workup at this time.    Recommend: 1) BP control 2) Continue Statin and ASA 3) Follow up with PCP  Neurology S/O Assessment and plan discussed with with attending physician and they are in agreement.    Felicie Morn PA-C [facility] [phone]  11/29/2012, 11:16 AM

## 2012-11-29 NOTE — Discharge Summary (Signed)
Physician Discharge Summary  [name] RUE:[id number] DOB: Aug 24, 1945 DOA: 11/27/2012  PCP: No PCP Per Patient  Admit date: 11/27/2012 Discharge date: 11/29/2012  Time spent: 40 minutes  Recommendations for Outpatient Follow-up:   Stroke like symptoms - workup negative for new stroke  - Given elevated blood pressures continue lisinopril 5 mg daily - continue 20 mg daily  hypertension  -Continue ACE inhibitor  - Although BP not within AHA guidelines would not lower  further acutely. PCP to monitor   Alcohol use  - patient reportedly only drinks one to 2 drinks a day and does not report any anxiety if he does not drink daily. Apparently patient was very sleepy on Ativan as such we'll discontinue Ativan   Hyperlipidemia  - Continue Zocor 20 mg daily     Discharge Diagnoses:  Principal Problem:   CVA (cerebral infarction) Active Problems:   Elevated blood pressure   Hyperlipidemia   Hypertension   Habitual alcohol use   Discharge Condition: stable  Diet recommendation: Heart healthy  Filed Weights   11/27/12 2327  Weight: 86.592 kg (190 lb 14.4 oz)    History of present illness:  Jesse Goodwin is a 67 y.o.    PMHx Hx stroke, HTN and hyperlipidemia status post right-sided carotid endarterectomy has been experiencing some difficulty walking since morning due to balance issues. Later he went to sleep and woke up afternoon after which he still had some difficulty walking and had come to the ER. By the time patient reached ER his symptoms have largely resolved and CT head did not show any acute but did show age-indeterminate frontal lobe lateral infarct and patient has been made for further management. Patient denies any weakness of the upper extremities denies any blurred vision difficulty speaking or swallowing. Denies any chest pain or shortness of breath.     Procedures:  MRI brain without contrast 11/27/2012 No acute infarct.  Mild small vessel disease type  changes.  Atrophy.  Paranasal sinus mucosal thickening.  Please see above.  CT head without contrast 11/27/2012 1. Small low-density focus in the right frontal white matter,  nonspecific but may represent a lacunar infarct of indeterminate  age.  2. No evidence of acute cortical infarct or intracranial hemorrhage.     Consultations:  Neurology  Discharge Exam: Filed Vitals:   11/28/12 2151 11/29/12 0200 11/29/12 0538 11/29/12 1021  BP: 172/78 108/69 131/81 137/92  Pulse: 69 68 59 65  Temp: 98.3 F (36.8 C) 98 F (36.7 C) 98.2 F (36.8 C) 98.3 F (36.8 C)  TempSrc: Oral Oral Oral Oral  Resp: 18 18 18 18   Height:      Weight:      SpO2: 94% 95% 98% 97%    General: A./O. x4, NAD Cardiovascular: Regular rhythm and rate, negative murmurs rubs or gallops Respiratory: Clear to auscultation bilateral Neurologic: Pupils equal round react to light and accommodation, cranial nerves II through XII intact, all extremities strength 5/5, sensation intact throughout, tongue/uvula midline, touch fingertips the normal limit, ambulation within normal limit, negative pronator drift, negative Romberg  Discharge Instructions     Medication List    ASK your doctor about these medications       aspirin 325 MG EC tablet  Take 325 mg by mouth daily.     cetirizine 10 MG tablet  Commonly known as:  ZYRTEC  Take 10 mg by mouth daily.     simvastatin 20 MG tablet  Commonly known as:  ZOCOR  Take  20 mg by mouth at bedtime.     VISINE OP  Place 2 drops into both eyes daily as needed (dry eye).       No Known Allergies    The results of significant diagnostics from this hospitalization (including imaging, microbiology, ancillary and laboratory) are listed below for reference.    Significant Diagnostic Studies: Dg Chest 2 View  11/28/2012   CLINICAL DATA:  Stroke.  EXAM: CHEST  2 VIEW  COMPARISON:  None.  FINDINGS: Heart size and pulmonary vascularity are normal and the lungs  are clear except for a tiny area of linear atelectasis or scarring low at the right lung base posteriorly. No significant osseous abnormality.  IMPRESSION: No significant abnormality.   Electronically Signed   By: Geanie Cooley M.D.   On: 11/28/2012 13:10   Ct Head Wo Contrast  11/27/2012   CLINICAL DATA:  Dizziness worse with standing and lightheadedness beginning this morning. History of stroke.  EXAM: CT HEAD WITHOUT CONTRAST  TECHNIQUE: Contiguous axial images were obtained from the base of the skull through the vertex without intravenous contrast.  COMPARISON:  None.  FINDINGS: The ventricles are prominent for age, suggestive of mildly advanced cerebral atrophy. There is a 1 cm round focus of hypoattenuation within the right frontal white matter (series 2, image 20). Periventricular white matter hypoattenuation is suggestive of mild chronic small vessel ischemic disease. There is no evidence of acute cortical infarct, mass, midline shift, intracranial hemorrhage, or extra-axial collection. The orbits are unremarkable. The mastoid air cells are clear. There is partial opacification of multiple ethmoid air cells bilaterally. Mucosal thickening is partially visualized in the alveolar recess of the left maxillary sinus.  IMPRESSION: 1. Small low-density focus in the right frontal white matter, nonspecific but may represent a lacunar infarct of indeterminate age. 2. No evidence of acute cortical infarct or intracranial hemorrhage.   Electronically Signed   By: Sebastian Ache   On: 11/27/2012 20:17   Mr Brain Wo Contrast  11/28/2012   CLINICAL DATA:  67 year old male with hyperlipidemia and history of prior stroke presenting with difficulty walking and balance issues. Symptoms largely resolved in emergency room. Prior carotid endarterectomy.  EXAM: MRI HEAD WITHOUT CONTRAST  TECHNIQUE: Multiplanar, multiecho pulse sequences of the brain and surrounding structures were obtained without intravenous contrast.   COMPARISON:  11/27/2012 CT.  No comparison MR.  FINDINGS: No acute infarct.  Scattered white matter type changes most notable right frontal lobe most consistent with result of small vessel disease in this hyperlipidemia patient.  No intracranial hemorrhage.  No intracranial mass lesion noted on this unenhanced exam.  Atrophy. Ventricular prominence most likely related to atrophy rather than hydrocephalus.  Major intracranial vascular structures are patent.  Paranasal sinus mucosal thickening.  Cervical medullary junction, pituitary region, pineal region and orbital structures unremarkable.  IMPRESSION: No acute infarct.  Mild small vessel disease type changes.  Atrophy.  Paranasal sinus mucosal thickening.  Please see above.   Electronically Signed   By: Bridgett Larsson M.D.   On: 11/28/2012 11:12    Microbiology: No results found for this or any previous visit (from the past 240 hour(s)).   Labs: Basic Metabolic Panel:  Recent Labs Lab 11/27/12 1545 11/28/12 0040  NA 141 141  K 4.0 3.3*  CL 105 104  CO2 23 27  GLUCOSE 135* 144*  BUN 16 15  CREATININE 0.82 0.89  CALCIUM 9.3 8.7   Liver Function Tests:  Recent Labs Lab 11/28/12 0040  AST 23  ALT 39  ALKPHOS 41  BILITOT 0.3  PROT 6.5  ALBUMIN 3.5   No results found for this basename: LIPASE, AMYLASE,  in the last 168 hours No results found for this basename: AMMONIA,  in the last 168 hours CBC:  Recent Labs Lab 11/27/12 1545 11/28/12 0040  WBC 6.2 5.9  NEUTROABS  --  3.1  HGB 17.1* 14.6  HCT 47.5 41.5  MCV 91.5 91.4  PLT 137* 162   Cardiac Enzymes: No results found for this basename: CKTOTAL, CKMB, CKMBINDEX, TROPONINI,  in the last 168 hours BNP: BNP (last 3 results) No results found for this basename: PROBNP,  in the last 8760 hours CBG:  Recent Labs Lab 11/27/12 1544  GLUCAP 131*       Signed:  Carolyne Littles, MD [facility] [phone] pager

## 2012-11-29 NOTE — Progress Notes (Signed)
Spoke with pt about Occupational Therapy.  Pt feels he has returned to baseline and is no longer dizzy or unsteady.  States he is I with all adls.  Will sign off at this time. Tory Emerald, [place] [phone]

## 2012-11-29 NOTE — Progress Notes (Signed)
Pt. DC home via car with family.  DC instructions given along with prescription.  Pt. Assessments were stable.

## 2012-12-16 DIAGNOSIS — R7309 Other abnormal glucose: Secondary | ICD-10-CM | POA: Diagnosis not present

## 2012-12-16 DIAGNOSIS — E7800 Pure hypercholesterolemia, unspecified: Secondary | ICD-10-CM | POA: Diagnosis not present

## 2012-12-16 DIAGNOSIS — I10 Essential (primary) hypertension: Secondary | ICD-10-CM | POA: Diagnosis not present

## 2012-12-16 DIAGNOSIS — N529 Male erectile dysfunction, unspecified: Secondary | ICD-10-CM | POA: Diagnosis not present

## 2012-12-16 DIAGNOSIS — J309 Allergic rhinitis, unspecified: Secondary | ICD-10-CM | POA: Diagnosis not present

## 2012-12-16 DIAGNOSIS — Z8673 Personal history of transient ischemic attack (TIA), and cerebral infarction without residual deficits: Secondary | ICD-10-CM | POA: Diagnosis not present

## 2012-12-16 DIAGNOSIS — I6529 Occlusion and stenosis of unspecified carotid artery: Secondary | ICD-10-CM | POA: Diagnosis not present

## 2013-01-20 DIAGNOSIS — E7800 Pure hypercholesterolemia, unspecified: Secondary | ICD-10-CM | POA: Diagnosis not present

## 2013-01-20 DIAGNOSIS — I6529 Occlusion and stenosis of unspecified carotid artery: Secondary | ICD-10-CM | POA: Diagnosis not present

## 2013-01-20 DIAGNOSIS — Z8349 Family history of other endocrine, nutritional and metabolic diseases: Secondary | ICD-10-CM | POA: Diagnosis not present

## 2013-01-20 DIAGNOSIS — I10 Essential (primary) hypertension: Secondary | ICD-10-CM | POA: Diagnosis not present

## 2013-01-20 DIAGNOSIS — R7309 Other abnormal glucose: Secondary | ICD-10-CM | POA: Diagnosis not present

## 2013-01-20 DIAGNOSIS — J309 Allergic rhinitis, unspecified: Secondary | ICD-10-CM | POA: Diagnosis not present

## 2013-01-20 DIAGNOSIS — Z8673 Personal history of transient ischemic attack (TIA), and cerebral infarction without residual deficits: Secondary | ICD-10-CM | POA: Diagnosis not present

## 2013-01-20 DIAGNOSIS — N529 Male erectile dysfunction, unspecified: Secondary | ICD-10-CM | POA: Diagnosis not present

## 2013-06-02 DIAGNOSIS — I10 Essential (primary) hypertension: Secondary | ICD-10-CM | POA: Diagnosis not present

## 2013-06-02 DIAGNOSIS — R05 Cough: Secondary | ICD-10-CM | POA: Diagnosis not present

## 2013-06-02 DIAGNOSIS — H6120 Impacted cerumen, unspecified ear: Secondary | ICD-10-CM | POA: Diagnosis not present

## 2013-06-02 DIAGNOSIS — J069 Acute upper respiratory infection, unspecified: Secondary | ICD-10-CM | POA: Diagnosis not present

## 2013-06-02 DIAGNOSIS — J309 Allergic rhinitis, unspecified: Secondary | ICD-10-CM | POA: Diagnosis not present

## 2013-06-02 DIAGNOSIS — R059 Cough, unspecified: Secondary | ICD-10-CM | POA: Diagnosis not present

## 2013-06-07 DIAGNOSIS — J309 Allergic rhinitis, unspecified: Secondary | ICD-10-CM | POA: Diagnosis not present

## 2013-06-07 DIAGNOSIS — R05 Cough: Secondary | ICD-10-CM | POA: Diagnosis not present

## 2013-06-07 DIAGNOSIS — R059 Cough, unspecified: Secondary | ICD-10-CM | POA: Diagnosis not present

## 2013-06-11 DIAGNOSIS — J309 Allergic rhinitis, unspecified: Secondary | ICD-10-CM | POA: Diagnosis not present

## 2013-06-11 DIAGNOSIS — R059 Cough, unspecified: Secondary | ICD-10-CM | POA: Diagnosis not present

## 2013-06-11 DIAGNOSIS — R05 Cough: Secondary | ICD-10-CM | POA: Diagnosis not present

## 2013-06-11 DIAGNOSIS — J40 Bronchitis, not specified as acute or chronic: Secondary | ICD-10-CM | POA: Diagnosis not present

## 2013-06-19 DIAGNOSIS — J309 Allergic rhinitis, unspecified: Secondary | ICD-10-CM | POA: Diagnosis not present

## 2013-06-19 DIAGNOSIS — R059 Cough, unspecified: Secondary | ICD-10-CM | POA: Diagnosis not present

## 2013-06-19 DIAGNOSIS — R05 Cough: Secondary | ICD-10-CM | POA: Diagnosis not present

## 2013-07-16 DIAGNOSIS — J301 Allergic rhinitis due to pollen: Secondary | ICD-10-CM | POA: Diagnosis not present

## 2013-07-16 DIAGNOSIS — R05 Cough: Secondary | ICD-10-CM | POA: Diagnosis not present

## 2013-07-16 DIAGNOSIS — R059 Cough, unspecified: Secondary | ICD-10-CM | POA: Diagnosis not present

## 2013-07-16 DIAGNOSIS — J3089 Other allergic rhinitis: Secondary | ICD-10-CM | POA: Diagnosis not present

## 2013-07-28 DIAGNOSIS — I10 Essential (primary) hypertension: Secondary | ICD-10-CM | POA: Diagnosis not present

## 2013-07-28 DIAGNOSIS — R42 Dizziness and giddiness: Secondary | ICD-10-CM | POA: Diagnosis not present

## 2013-07-28 DIAGNOSIS — Z1159 Encounter for screening for other viral diseases: Secondary | ICD-10-CM | POA: Diagnosis not present

## 2013-07-28 DIAGNOSIS — E7800 Pure hypercholesterolemia, unspecified: Secondary | ICD-10-CM | POA: Diagnosis not present

## 2013-07-28 DIAGNOSIS — Z0000 Encounter for general adult medical examination without abnormal findings: Secondary | ICD-10-CM | POA: Diagnosis not present

## 2013-07-28 DIAGNOSIS — R7309 Other abnormal glucose: Secondary | ICD-10-CM | POA: Diagnosis not present

## 2013-07-28 DIAGNOSIS — Z8349 Family history of other endocrine, nutritional and metabolic diseases: Secondary | ICD-10-CM | POA: Diagnosis not present

## 2013-07-28 DIAGNOSIS — Z8673 Personal history of transient ischemic attack (TIA), and cerebral infarction without residual deficits: Secondary | ICD-10-CM | POA: Diagnosis not present

## 2013-08-11 DIAGNOSIS — E7800 Pure hypercholesterolemia, unspecified: Secondary | ICD-10-CM | POA: Diagnosis not present

## 2013-08-11 DIAGNOSIS — I6529 Occlusion and stenosis of unspecified carotid artery: Secondary | ICD-10-CM | POA: Diagnosis not present

## 2013-08-11 DIAGNOSIS — R7309 Other abnormal glucose: Secondary | ICD-10-CM | POA: Diagnosis not present

## 2013-08-11 DIAGNOSIS — Z23 Encounter for immunization: Secondary | ICD-10-CM | POA: Diagnosis not present

## 2013-08-11 DIAGNOSIS — Z8673 Personal history of transient ischemic attack (TIA), and cerebral infarction without residual deficits: Secondary | ICD-10-CM | POA: Diagnosis not present

## 2013-08-11 DIAGNOSIS — I10 Essential (primary) hypertension: Secondary | ICD-10-CM | POA: Diagnosis not present

## 2013-10-19 DIAGNOSIS — Z23 Encounter for immunization: Secondary | ICD-10-CM | POA: Diagnosis not present

## 2013-10-21 DIAGNOSIS — J3089 Other allergic rhinitis: Secondary | ICD-10-CM | POA: Diagnosis not present

## 2013-10-21 DIAGNOSIS — J301 Allergic rhinitis due to pollen: Secondary | ICD-10-CM | POA: Diagnosis not present

## 2013-10-21 DIAGNOSIS — R05 Cough: Secondary | ICD-10-CM | POA: Diagnosis not present

## 2013-11-07 DIAGNOSIS — R7301 Impaired fasting glucose: Secondary | ICD-10-CM | POA: Diagnosis not present

## 2013-11-07 DIAGNOSIS — E780 Pure hypercholesterolemia: Secondary | ICD-10-CM | POA: Diagnosis not present

## 2013-11-07 DIAGNOSIS — I10 Essential (primary) hypertension: Secondary | ICD-10-CM | POA: Diagnosis not present

## 2013-11-07 DIAGNOSIS — I6529 Occlusion and stenosis of unspecified carotid artery: Secondary | ICD-10-CM | POA: Diagnosis not present

## 2013-11-07 DIAGNOSIS — Z8673 Personal history of transient ischemic attack (TIA), and cerebral infarction without residual deficits: Secondary | ICD-10-CM | POA: Diagnosis not present

## 2013-11-11 DIAGNOSIS — I10 Essential (primary) hypertension: Secondary | ICD-10-CM | POA: Diagnosis not present

## 2013-11-11 DIAGNOSIS — Z8673 Personal history of transient ischemic attack (TIA), and cerebral infarction without residual deficits: Secondary | ICD-10-CM | POA: Diagnosis not present

## 2013-11-11 DIAGNOSIS — R7301 Impaired fasting glucose: Secondary | ICD-10-CM | POA: Diagnosis not present

## 2013-11-11 DIAGNOSIS — I6523 Occlusion and stenosis of bilateral carotid arteries: Secondary | ICD-10-CM | POA: Diagnosis not present

## 2013-11-11 DIAGNOSIS — N529 Male erectile dysfunction, unspecified: Secondary | ICD-10-CM | POA: Diagnosis not present

## 2013-11-11 DIAGNOSIS — E780 Pure hypercholesterolemia: Secondary | ICD-10-CM | POA: Diagnosis not present

## 2013-11-11 DIAGNOSIS — J309 Allergic rhinitis, unspecified: Secondary | ICD-10-CM | POA: Diagnosis not present

## 2013-11-11 DIAGNOSIS — Z125 Encounter for screening for malignant neoplasm of prostate: Secondary | ICD-10-CM | POA: Diagnosis not present

## 2013-11-18 ENCOUNTER — Other Ambulatory Visit: Payer: Self-pay | Admitting: *Deleted

## 2013-11-18 ENCOUNTER — Encounter: Payer: Self-pay | Admitting: Surgery

## 2013-11-18 DIAGNOSIS — Z48812 Encounter for surgical aftercare following surgery on the circulatory system: Secondary | ICD-10-CM

## 2013-11-18 DIAGNOSIS — I6523 Occlusion and stenosis of bilateral carotid arteries: Secondary | ICD-10-CM

## 2013-12-10 ENCOUNTER — Encounter: Payer: Self-pay | Admitting: Surgery

## 2013-12-15 ENCOUNTER — Ambulatory Visit (HOSPITAL_COMMUNITY)
Admission: RE | Admit: 2013-12-15 | Discharge: 2013-12-15 | Disposition: A | Discharge status: Home / Self Care | Payer: Medicare Other | Source: Ambulatory Visit | Attending: Surgery | Admitting: Surgery

## 2013-12-15 ENCOUNTER — Ambulatory Visit (INDEPENDENT_AMBULATORY_CARE_PROVIDER_SITE_OTHER): Payer: Medicare Other | Admitting: Surgery

## 2013-12-15 ENCOUNTER — Encounter: Payer: Self-pay | Admitting: Surgery

## 2013-12-15 VITALS — BP 151/89 | HR 64 | Ht 70.0 in | Wt 191.0 lb

## 2013-12-15 DIAGNOSIS — I6523 Occlusion and stenosis of bilateral carotid arteries: Secondary | ICD-10-CM

## 2013-12-15 DIAGNOSIS — I6521 Occlusion and stenosis of right carotid artery: Secondary | ICD-10-CM | POA: Diagnosis not present

## 2013-12-15 DIAGNOSIS — Z48812 Encounter for surgical aftercare following surgery on the circulatory system: Secondary | ICD-10-CM | POA: Diagnosis not present

## 2013-12-15 NOTE — Addendum Note (Signed)
Addended by: Dorthula Rue L on: 12/15/2013 11:46 AM   Modules accepted: Orders

## 2013-12-15 NOTE — Progress Notes (Signed)
Patient name: Jesse Goodwin MRN: [id number] DOB: 29-Dec-1945 Sex: male   Referred by: Dr. Leonides Schanz  Reason for referral:  Chief Complaint  Patient presents with  . New Evaluation    carotid stenosis - hx of R CEA 2005 - needs to establish care    HISTORY OF PRESENT ILLNESS: This is a 68 year old gentleman who is here to establish continuity of vascular care.  The patient has a history of a right carotid endarterectomy performed by Dr. Terence Lux and probably in 2004.  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  The patient was having dysarthria as well as left arm weakness.  The patient has not had any symptoms of numbness or weakness, slurred speech, or amaurosis fugax, since his carotid endarterectomy  The patient has a strong family history for cardiovascular issues and diabetes in his mother.  The patient has never smoked.  He is medically managed for hypercholesterolemia with a statin.  He denies any cardiac history.  He denies claudication symptoms.  Past Medical History  Diagnosis Date  . Stroke   . Hypercholesteremia     Past Surgical History  Procedure Laterality Date  . Carotid endarterectomy    . Cholecystectomy      History   Social History  . Marital Status: Married    Spouse Name: N/A    Number of Children: N/A  . Years of Education: N/A   Occupational History  . Not on file.   Social History Main Topics  . Smoking status: Never Smoker   . Smokeless tobacco: Not on file  . Alcohol Use: 8.4 oz/week    7 Glasses of wine, 7 Shots of liquor per week     Comment: drinks mixed drinks and wine in the night.  . Drug Use: No  . Sexual Activity: Not on file   Other Topics Concern  . Not on file   Social History Narrative    Family History  Problem Relation Age of Onset  . Stroke Mother   . Breast cancer Mother   . Dementia Mother   . CAD Mother   . Diabetes Mellitus II Mother   . Cancer Mother   . Diabetes Mother   . Heart disease Mother   .  Hyperlipidemia Mother   . Hypertension Mother   . Hyperlipidemia Brother   . Hyperlipidemia Son     Allergies as of 12/15/2013  . (No Known Allergies)    Current Outpatient Prescriptions on File Prior to Visit  Medication Sig Dispense Refill  . aspirin 325 MG EC tablet Take 325 mg by mouth daily.    Marland Kitchen lisinopril (PRINIVIL,ZESTRIL) 5 MG tablet Take 1 tablet (5 mg total) by mouth daily. 30 tablet 0  . simvastatin (ZOCOR) 20 MG tablet Take 20 mg by mouth at bedtime.    . Tetrahydrozoline HCl (VISINE OP) Place 2 drops into both eyes daily as needed (dry eye).    . cetirizine (ZYRTEC) 10 MG tablet Take 10 mg by mouth daily.     No current facility-administered medications on file prior to visit.     REVIEW OF SYSTEMS: Cardiovascular: No chest pain, chest pressure, palpitations, orthopnea, or dyspnea on exertion. No claudication or rest pain,  No history of DVT or phlebitis. Pulmonary: No productive cough, asthma or wheezing. Neurologic: No weakness, paresthesias, aphasia, or amaurosis. No dizziness. Hematologic: No bleeding problems or clotting disorders. Musculoskeletal: No joint pain or joint swelling. Gastrointestinal: No blood in stool or hematemesis Genitourinary:  No dysuria or hematuria. Psychiatric:: No history of major depression. Integumentary: No rashes or ulcers. Constitutional: No fever or chills.  PHYSICAL EXAMINATION: General: The patient appears their stated age.  Vital signs are BP 151/89 mmHg  Pulse 64  Ht 5\' 10"  (1.778 m)  Wt 191 lb (86.637 kg)  BMI 27.41 kg/m2  SpO2 96% HEENT:  No gross abnormalities Pulmonary: Respirations are non-labored Abdomen: Soft and non-tender .  No pulsatile mass Musculoskeletal: There are no major deformities.   Neurologic: No focal weakness or paresthesias are detected, Skin: There are no ulcer or rashes noted. Psychiatric: The patient has normal affect. Cardiovascular: There is a regular rate and rhythm without significant  murmur appreciated.  No carotid bruits.  Palpable posterior tibial pulses bilaterally  Diagnostic Studies: Carotid Doppler studies were ordered and reviewed.  This shows mild hyperplasia in the right carotid surgical bulb.  The left carotid velocities suggest less than 40% stenosis.    Assessment:  History of right carotid endarterectomy Plan: The patient has no acute vascular issues.  He is here to establish continuity of care.  He has a history of right carotid endarterectomy done for symptomatic stenosis in 2005 by Dr. Terence Lux and probably.  His ultrasound today shows no evidence of recurrence.  He is appropriately managed for his hypercholesterolemia with a statin.  He is on ACE inhibitor for his blood pressure.  He takes a full dose aspirin for antiplatelet treatment.  The patient is scheduled for follow-up in 1 year with a repeat carotid ultrasound.  I could not palpate a aneurysm.  There is no family history of aneurysm.     Eldridge Abrahams, M.D. [facility] Office: [phone] Pager:  [phone]

## 2014-03-23 DIAGNOSIS — R05 Cough: Secondary | ICD-10-CM | POA: Diagnosis not present

## 2014-05-01 DIAGNOSIS — R05 Cough: Secondary | ICD-10-CM | POA: Diagnosis not present

## 2014-05-01 DIAGNOSIS — J301 Allergic rhinitis due to pollen: Secondary | ICD-10-CM | POA: Diagnosis not present

## 2014-05-01 DIAGNOSIS — J3089 Other allergic rhinitis: Secondary | ICD-10-CM | POA: Diagnosis not present

## 2014-05-07 DIAGNOSIS — N529 Male erectile dysfunction, unspecified: Secondary | ICD-10-CM | POA: Diagnosis not present

## 2014-05-07 DIAGNOSIS — Z8673 Personal history of transient ischemic attack (TIA), and cerebral infarction without residual deficits: Secondary | ICD-10-CM | POA: Diagnosis not present

## 2014-05-07 DIAGNOSIS — R7301 Impaired fasting glucose: Secondary | ICD-10-CM | POA: Diagnosis not present

## 2014-05-07 DIAGNOSIS — I6523 Occlusion and stenosis of bilateral carotid arteries: Secondary | ICD-10-CM | POA: Diagnosis not present

## 2014-05-07 DIAGNOSIS — Z125 Encounter for screening for malignant neoplasm of prostate: Secondary | ICD-10-CM | POA: Diagnosis not present

## 2014-05-07 DIAGNOSIS — I10 Essential (primary) hypertension: Secondary | ICD-10-CM | POA: Diagnosis not present

## 2014-05-07 DIAGNOSIS — Z8349 Family history of other endocrine, nutritional and metabolic diseases: Secondary | ICD-10-CM | POA: Diagnosis not present

## 2014-05-07 DIAGNOSIS — E780 Pure hypercholesterolemia: Secondary | ICD-10-CM | POA: Diagnosis not present

## 2014-05-07 DIAGNOSIS — J309 Allergic rhinitis, unspecified: Secondary | ICD-10-CM | POA: Diagnosis not present

## 2014-05-11 DIAGNOSIS — I6523 Occlusion and stenosis of bilateral carotid arteries: Secondary | ICD-10-CM | POA: Diagnosis not present

## 2014-05-11 DIAGNOSIS — I10 Essential (primary) hypertension: Secondary | ICD-10-CM | POA: Diagnosis not present

## 2014-05-11 DIAGNOSIS — Z8673 Personal history of transient ischemic attack (TIA), and cerebral infarction without residual deficits: Secondary | ICD-10-CM | POA: Diagnosis not present

## 2014-05-11 DIAGNOSIS — N529 Male erectile dysfunction, unspecified: Secondary | ICD-10-CM | POA: Diagnosis not present

## 2014-05-11 DIAGNOSIS — R7301 Impaired fasting glucose: Secondary | ICD-10-CM | POA: Diagnosis not present

## 2014-05-11 DIAGNOSIS — Z125 Encounter for screening for malignant neoplasm of prostate: Secondary | ICD-10-CM | POA: Diagnosis not present

## 2014-05-11 DIAGNOSIS — E780 Pure hypercholesterolemia: Secondary | ICD-10-CM | POA: Diagnosis not present

## 2014-11-06 DIAGNOSIS — E7800 Pure hypercholesterolemia, unspecified: Secondary | ICD-10-CM | POA: Diagnosis not present

## 2014-11-06 DIAGNOSIS — R7301 Impaired fasting glucose: Secondary | ICD-10-CM | POA: Diagnosis not present

## 2014-11-06 DIAGNOSIS — Z8673 Personal history of transient ischemic attack (TIA), and cerebral infarction without residual deficits: Secondary | ICD-10-CM | POA: Diagnosis not present

## 2014-11-06 DIAGNOSIS — Z125 Encounter for screening for malignant neoplasm of prostate: Secondary | ICD-10-CM | POA: Diagnosis not present

## 2014-11-06 DIAGNOSIS — I6523 Occlusion and stenosis of bilateral carotid arteries: Secondary | ICD-10-CM | POA: Diagnosis not present

## 2014-11-06 DIAGNOSIS — N529 Male erectile dysfunction, unspecified: Secondary | ICD-10-CM | POA: Diagnosis not present

## 2014-11-06 DIAGNOSIS — I10 Essential (primary) hypertension: Secondary | ICD-10-CM | POA: Diagnosis not present

## 2014-11-10 DIAGNOSIS — Z23 Encounter for immunization: Secondary | ICD-10-CM | POA: Diagnosis not present

## 2014-11-10 DIAGNOSIS — N529 Male erectile dysfunction, unspecified: Secondary | ICD-10-CM | POA: Diagnosis not present

## 2014-11-10 DIAGNOSIS — I10 Essential (primary) hypertension: Secondary | ICD-10-CM | POA: Diagnosis not present

## 2014-11-10 DIAGNOSIS — R6882 Decreased libido: Secondary | ICD-10-CM | POA: Diagnosis not present

## 2014-11-10 DIAGNOSIS — Z8673 Personal history of transient ischemic attack (TIA), and cerebral infarction without residual deficits: Secondary | ICD-10-CM | POA: Diagnosis not present

## 2014-11-10 DIAGNOSIS — E7800 Pure hypercholesterolemia, unspecified: Secondary | ICD-10-CM | POA: Diagnosis not present

## 2014-11-10 DIAGNOSIS — Z1389 Encounter for screening for other disorder: Secondary | ICD-10-CM | POA: Diagnosis not present

## 2014-11-10 DIAGNOSIS — R7301 Impaired fasting glucose: Secondary | ICD-10-CM | POA: Diagnosis not present

## 2014-11-18 DIAGNOSIS — R946 Abnormal results of thyroid function studies: Secondary | ICD-10-CM | POA: Diagnosis not present

## 2014-11-24 DIAGNOSIS — R946 Abnormal results of thyroid function studies: Secondary | ICD-10-CM | POA: Diagnosis not present

## 2014-11-24 DIAGNOSIS — E291 Testicular hypofunction: Secondary | ICD-10-CM | POA: Diagnosis not present

## 2014-12-16 ENCOUNTER — Encounter: Payer: Self-pay | Admitting: Family

## 2014-12-21 ENCOUNTER — Ambulatory Visit (HOSPITAL_COMMUNITY)
Admission: RE | Admit: 2014-12-21 | Discharge: 2014-12-21 | Disposition: A | Discharge status: Home / Self Care | Payer: Medicare Other | Source: Ambulatory Visit | Attending: Family | Admitting: Family

## 2014-12-21 ENCOUNTER — Ambulatory Visit (INDEPENDENT_AMBULATORY_CARE_PROVIDER_SITE_OTHER): Payer: Medicare Other | Admitting: Family

## 2014-12-21 ENCOUNTER — Encounter: Payer: Self-pay | Admitting: Family

## 2014-12-21 VITALS — BP 140/80 | HR 67 | Ht 70.0 in | Wt 201.3 lb

## 2014-12-21 DIAGNOSIS — Z9889 Other specified postprocedural states: Secondary | ICD-10-CM

## 2014-12-21 DIAGNOSIS — I6521 Occlusion and stenosis of right carotid artery: Secondary | ICD-10-CM

## 2014-12-21 DIAGNOSIS — Z48812 Encounter for surgical aftercare following surgery on the circulatory system: Secondary | ICD-10-CM

## 2014-12-21 DIAGNOSIS — E7800 Pure hypercholesterolemia, unspecified: Secondary | ICD-10-CM

## 2014-12-21 NOTE — Progress Notes (Signed)
Chief Complaint: Carotid Artery Stenosis   History of Present Illness  Jesse Goodwin is a 69 y.o. male who initially saw Dr. Trula Slade in  November of 2015 to establish continuity of vascular care. The patient has a history of a right carotid endarterectomy performed by Dr. Terence Lux and probably in 2004. 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 The patient was having dysarthria as well as left arm weakness. The patient has not had any symptoms of numbness or weakness, slurred speech, or amaurosis fugax, since his carotid endarterectomy.  The patient has a strong family history for cardiovascular issues and diabetes in his mother. The patient has never smoked. He is medically managed for hypercholesterolemia with a statin. He denies any cardiac history. He denies claudication symptoms.   For exercise he runs 3 days/week.  2The patient denies New Medical or Surgical History.  Pt Diabetic: no Pt smoker: non-smoker. He was exposed to secondhand smoke from his father as a child.  Pt meds include: Statin : yes ASA: yes Other anticoagulants/antiplatelets: no   Past Medical History  Diagnosis Date  . Stroke   . Hypercholesteremia     Social History Social History  Substance Use Topics  . Smoking status: Never Smoker   . Smokeless tobacco: Not on file  . Alcohol Use: 8.4 oz/week    7 Glasses of wine, 7 Shots of liquor per week     Comment: drinks mixed drinks and wine in the night.    Family History Family History  Problem Relation Age of Onset  . Stroke Mother   . Breast cancer Mother   . Dementia Mother   . CAD Mother   . Diabetes Mellitus II Mother   . Cancer Mother   . Diabetes Mother   . Heart disease Mother   . Hyperlipidemia Mother   . Hypertension Mother   . Hyperlipidemia Brother   . Hyperlipidemia Son     Surgical History Past Surgical History  Procedure Laterality Date  . Carotid endarterectomy    . Cholecystectomy      No Known  Allergies  Current Outpatient Prescriptions  Medication Sig Dispense Refill  . aspirin 325 MG EC tablet Take 325 mg by mouth daily.    . cetirizine (ZYRTEC) 10 MG tablet Take 10 mg by mouth daily.    Marland Kitchen lisinopril (PRINIVIL,ZESTRIL) 5 MG tablet Take 1 tablet (5 mg total) by mouth daily. 30 tablet 0  . simvastatin (ZOCOR) 20 MG tablet Take 20 mg by mouth at bedtime.    . Tetrahydrozoline HCl (VISINE OP) Place 2 drops into both eyes daily as needed (dry eye).     No current facility-administered medications for this visit.    Review of Systems : See HPI for pertinent positives and negatives.  Physical Examination  Filed Vitals:   12/21/14 0951 12/21/14 0953  BP: 144/88 140/80  Pulse: 67   Height: 5\' 10"  (1.778 m)   Weight: 201 lb 4.8 oz (91.309 kg)   SpO2: 98%    Body mass index is 28.88 kg/(m^2).  General: WDWN male in NAD GAIT: normal Eyes: PERRLA Pulmonary:  Non-labored, CTAB, no rales,  rhonchi, or wheezing.  Cardiac: regular rhythm,  no detected murmur.  VASCULAR EXAM Carotid Bruits Right Left   Negative Negative    Aorta is not palpable. Radial pulses are 2+ palpable and equal.  LE Pulses Right Left       POPLITEAL  not palpable   not palpable       POSTERIOR TIBIAL  2+ palpable   2+ palpable        DORSALIS PEDIS      ANTERIOR TIBIAL 2+ palpable  2+ palpable     Gastrointestinal: soft, nontender, BS WNL, no r/g,  no palpable masses.  Musculoskeletal: No muscle atrophy/wasting. M/S 5/5 throughout, extremities without ischemic changes.  Neurologic: A&O X 3; Appropriate Affect, Speech is normal CN 2-12 intact, pain and light touch intact in extremities, Motor exam as listed above.   Non-Invasive Vascular Imaging CAROTID DUPLEX 12/21/2014   Bilateral ICA with 1-39% stenoses. Patent right ICA site. Vertebral artery are antegrade. No  significant stenosis of the bilateral external or common carotid arteries.   Assessment: Jesse Goodwin is a 69 y.o. male who had a preoperative stroke. He had a right CEA in about 2004 or 2005 by a vascular surgeon in [place]. He moved to this area about 2014 and sees [place] for continuity of care. He has no residual left arm symptoms from his stroke and has had no subsequent strokes or TIA's. Today's carotid duplex suggests minimal bilateral ICA stenoses. His atherosclerotic risk factors are minimal.   Plan: Follow-up in 1 year with Carotid Duplex scan.   I discussed in depth with the patient the nature of atherosclerosis, and emphasized the importance of maximal medical management including strict control of blood pressure, blood glucose, and lipid levels, obtaining regular exercise, and continued cessation of smoking.  The patient is aware that without maximal medical management the underlying atherosclerotic disease process will progress, limiting the benefit of any interventions. The patient was given information about stroke prevention and what symptoms should prompt the patient to seek immediate medical care. Thank you for allowing [place] to participate in this patient's care.  Clemon Chambers, RN, MSN, FNP-C [facility] Office: [facility] Physician: Trula Slade  12/21/2014 9:32 AM

## 2014-12-21 NOTE — Addendum Note (Signed)
Addended by: Dorthula Rue L on: 12/21/2014 01:55 PM   Modules accepted: Orders

## 2014-12-21 NOTE — Patient Instructions (Signed)
Stroke Prevention Some medical conditions and behaviors are associated with an increased chance of having a stroke. You may prevent a stroke by making healthy choices and managing medical conditions. HOW CAN I REDUCE MY RISK OF HAVING A STROKE?   Stay physically active. Get at least 30 minutes of activity on most or all days.  Do not smoke. It may also be helpful to avoid exposure to secondhand smoke.  Limit alcohol use. Moderate alcohol use is considered to be:  No more than 2 drinks per day for men.  No more than 1 drink per day for nonpregnant women.  Eat healthy foods. This involves:  Eating 5 or more servings of fruits and vegetables a day.  Making dietary changes that address high blood pressure (hypertension), high cholesterol, diabetes, or obesity.  Manage your cholesterol levels.  Making food choices that are high in fiber and low in saturated fat, trans fat, and cholesterol may control cholesterol levels.  Take any prescribed medicines to control cholesterol as directed by your health care provider.  Manage your diabetes.  Controlling your carbohydrate and sugar intake is recommended to manage diabetes.  Take any prescribed medicines to control diabetes as directed by your health care provider.  Control your hypertension.  Making food choices that are low in salt (sodium), saturated fat, trans fat, and cholesterol is recommended to manage hypertension.  Ask your health care provider if you need treatment to lower your blood pressure. Take any prescribed medicines to control hypertension as directed by your health care provider.  If you are 18-39 years of age, have your blood pressure checked every 3-5 years. If you are 40 years of age or older, have your blood pressure checked every year.  Maintain a healthy weight.  Reducing calorie intake and making food choices that are low in sodium, saturated fat, trans fat, and cholesterol are recommended to manage  weight.  Stop drug abuse.  Avoid taking birth control pills.  Talk to your health care provider about the risks of taking birth control pills if you are over 35 years old, smoke, get migraines, or have ever had a blood clot.  Get evaluated for sleep disorders (sleep apnea).  Talk to your health care provider about getting a sleep evaluation if you snore a lot or have excessive sleepiness.  Take medicines only as directed by your health care provider.  For some people, aspirin or blood thinners (anticoagulants) are helpful in reducing the risk of forming abnormal blood clots that can lead to stroke. If you have the irregular heart rhythm of atrial fibrillation, you should be on a blood thinner unless there is a good reason you cannot take them.  Understand all your medicine instructions.  Make sure that other conditions (such as anemia or atherosclerosis) are addressed. SEEK IMMEDIATE MEDICAL CARE IF:   You have sudden weakness or numbness of the face, arm, or leg, especially on one side of the body.  Your face or eyelid droops to one side.  You have sudden confusion.  You have trouble speaking (aphasia) or understanding.  You have sudden trouble seeing in one or both eyes.  You have sudden trouble walking.  You have dizziness.  You have a loss of balance or coordination.  You have a sudden, severe headache with no known cause.  You have new chest pain or an irregular heartbeat. Any of these symptoms may represent a serious problem that is an emergency. Do not wait to see if the symptoms will   go away. Get medical help at once. Call your local emergency services (911 in [place] Do not drive yourself to the hospital.   This information is not intended to replace advice given to you by your health care provider. Make sure you discuss any questions you have with your health care provider.   Document Released: 02/10/2004 Document Revised: 01/23/2014 Document Reviewed:  07/05/2012 Elsevier Interactive Patient Education 2016 Elsevier Inc.  

## 2015-03-05 DIAGNOSIS — R946 Abnormal results of thyroid function studies: Secondary | ICD-10-CM | POA: Diagnosis not present

## 2015-03-05 DIAGNOSIS — E291 Testicular hypofunction: Secondary | ICD-10-CM | POA: Diagnosis not present

## 2015-05-03 DIAGNOSIS — R05 Cough: Secondary | ICD-10-CM | POA: Diagnosis not present

## 2015-05-03 DIAGNOSIS — J3089 Other allergic rhinitis: Secondary | ICD-10-CM | POA: Diagnosis not present

## 2015-05-03 DIAGNOSIS — J301 Allergic rhinitis due to pollen: Secondary | ICD-10-CM | POA: Diagnosis not present

## 2015-05-12 DIAGNOSIS — Z125 Encounter for screening for malignant neoplasm of prostate: Secondary | ICD-10-CM | POA: Diagnosis not present

## 2015-05-12 DIAGNOSIS — Z136 Encounter for screening for cardiovascular disorders: Secondary | ICD-10-CM | POA: Diagnosis not present

## 2015-05-12 DIAGNOSIS — E291 Testicular hypofunction: Secondary | ICD-10-CM | POA: Diagnosis not present

## 2015-05-12 DIAGNOSIS — Z79899 Other long term (current) drug therapy: Secondary | ICD-10-CM | POA: Diagnosis not present

## 2015-05-12 DIAGNOSIS — R946 Abnormal results of thyroid function studies: Secondary | ICD-10-CM | POA: Diagnosis not present

## 2015-05-12 DIAGNOSIS — Z1389 Encounter for screening for other disorder: Secondary | ICD-10-CM | POA: Diagnosis not present

## 2015-05-12 DIAGNOSIS — I6523 Occlusion and stenosis of bilateral carotid arteries: Secondary | ICD-10-CM | POA: Diagnosis not present

## 2015-05-12 DIAGNOSIS — R7301 Impaired fasting glucose: Secondary | ICD-10-CM | POA: Diagnosis not present

## 2015-05-12 DIAGNOSIS — I10 Essential (primary) hypertension: Secondary | ICD-10-CM | POA: Diagnosis not present

## 2015-05-12 DIAGNOSIS — E7800 Pure hypercholesterolemia, unspecified: Secondary | ICD-10-CM | POA: Diagnosis not present

## 2015-05-13 DIAGNOSIS — E291 Testicular hypofunction: Secondary | ICD-10-CM | POA: Diagnosis not present

## 2015-05-13 DIAGNOSIS — Z7189 Other specified counseling: Secondary | ICD-10-CM | POA: Diagnosis not present

## 2015-05-13 DIAGNOSIS — R972 Elevated prostate specific antigen [PSA]: Secondary | ICD-10-CM | POA: Diagnosis not present

## 2015-05-13 DIAGNOSIS — Z0001 Encounter for general adult medical examination with abnormal findings: Secondary | ICD-10-CM | POA: Diagnosis not present

## 2015-05-13 DIAGNOSIS — Z23 Encounter for immunization: Secondary | ICD-10-CM | POA: Diagnosis not present

## 2015-05-13 DIAGNOSIS — N529 Male erectile dysfunction, unspecified: Secondary | ICD-10-CM | POA: Diagnosis not present

## 2015-05-13 DIAGNOSIS — R7301 Impaired fasting glucose: Secondary | ICD-10-CM | POA: Diagnosis not present

## 2015-05-13 DIAGNOSIS — Z1389 Encounter for screening for other disorder: Secondary | ICD-10-CM | POA: Diagnosis not present

## 2015-05-13 DIAGNOSIS — I10 Essential (primary) hypertension: Secondary | ICD-10-CM | POA: Diagnosis not present

## 2015-05-13 DIAGNOSIS — E7800 Pure hypercholesterolemia, unspecified: Secondary | ICD-10-CM | POA: Diagnosis not present

## 2015-05-13 DIAGNOSIS — J309 Allergic rhinitis, unspecified: Secondary | ICD-10-CM | POA: Diagnosis not present

## 2015-05-13 DIAGNOSIS — Z8679 Personal history of other diseases of the circulatory system: Secondary | ICD-10-CM | POA: Diagnosis not present

## 2015-08-13 DIAGNOSIS — R972 Elevated prostate specific antigen [PSA]: Secondary | ICD-10-CM | POA: Diagnosis not present

## 2015-08-23 DIAGNOSIS — Z1210 Encounter for screening for malignant neoplasm of intestinal tract, unspecified: Secondary | ICD-10-CM | POA: Diagnosis not present

## 2015-08-27 DIAGNOSIS — H1131 Conjunctival hemorrhage, right eye: Secondary | ICD-10-CM | POA: Diagnosis not present

## 2015-09-03 DIAGNOSIS — Z1211 Encounter for screening for malignant neoplasm of colon: Secondary | ICD-10-CM | POA: Diagnosis not present

## 2015-09-03 DIAGNOSIS — D123 Benign neoplasm of transverse colon: Secondary | ICD-10-CM | POA: Diagnosis not present

## 2015-09-03 DIAGNOSIS — D126 Benign neoplasm of colon, unspecified: Secondary | ICD-10-CM | POA: Diagnosis not present

## 2015-09-03 DIAGNOSIS — K648 Other hemorrhoids: Secondary | ICD-10-CM | POA: Diagnosis not present

## 2015-09-03 DIAGNOSIS — K5730 Diverticulosis of large intestine without perforation or abscess without bleeding: Secondary | ICD-10-CM | POA: Diagnosis not present

## 2015-10-13 DIAGNOSIS — Z23 Encounter for immunization: Secondary | ICD-10-CM | POA: Diagnosis not present

## 2015-11-08 DIAGNOSIS — R972 Elevated prostate specific antigen [PSA]: Secondary | ICD-10-CM | POA: Diagnosis not present

## 2015-11-15 DIAGNOSIS — R7301 Impaired fasting glucose: Secondary | ICD-10-CM | POA: Diagnosis not present

## 2015-11-15 DIAGNOSIS — R946 Abnormal results of thyroid function studies: Secondary | ICD-10-CM | POA: Diagnosis not present

## 2015-11-15 DIAGNOSIS — I10 Essential (primary) hypertension: Secondary | ICD-10-CM | POA: Diagnosis not present

## 2015-11-15 DIAGNOSIS — R972 Elevated prostate specific antigen [PSA]: Secondary | ICD-10-CM | POA: Diagnosis not present

## 2015-11-15 DIAGNOSIS — E291 Testicular hypofunction: Secondary | ICD-10-CM | POA: Diagnosis not present

## 2015-11-15 DIAGNOSIS — Z79899 Other long term (current) drug therapy: Secondary | ICD-10-CM | POA: Diagnosis not present

## 2015-11-22 DIAGNOSIS — N529 Male erectile dysfunction, unspecified: Secondary | ICD-10-CM | POA: Diagnosis not present

## 2015-11-22 DIAGNOSIS — R7303 Prediabetes: Secondary | ICD-10-CM | POA: Diagnosis not present

## 2015-11-22 DIAGNOSIS — E7800 Pure hypercholesterolemia, unspecified: Secondary | ICD-10-CM | POA: Diagnosis not present

## 2015-11-22 DIAGNOSIS — E291 Testicular hypofunction: Secondary | ICD-10-CM | POA: Diagnosis not present

## 2015-11-22 DIAGNOSIS — I10 Essential (primary) hypertension: Secondary | ICD-10-CM | POA: Diagnosis not present

## 2015-11-22 DIAGNOSIS — R972 Elevated prostate specific antigen [PSA]: Secondary | ICD-10-CM | POA: Diagnosis not present

## 2015-11-22 DIAGNOSIS — Z8673 Personal history of transient ischemic attack (TIA), and cerebral infarction without residual deficits: Secondary | ICD-10-CM | POA: Diagnosis not present

## 2015-11-22 DIAGNOSIS — J309 Allergic rhinitis, unspecified: Secondary | ICD-10-CM | POA: Diagnosis not present

## 2015-12-22 ENCOUNTER — Encounter: Payer: Self-pay | Admitting: Family

## 2015-12-27 ENCOUNTER — Encounter: Payer: Self-pay | Admitting: Family

## 2015-12-27 ENCOUNTER — Ambulatory Visit (HOSPITAL_COMMUNITY)
Admission: RE | Admit: 2015-12-27 | Discharge: 2015-12-27 | Disposition: A | Discharge status: Home / Self Care | Payer: Medicare Other | Source: Ambulatory Visit | Attending: Family | Admitting: Family

## 2015-12-27 ENCOUNTER — Ambulatory Visit (INDEPENDENT_AMBULATORY_CARE_PROVIDER_SITE_OTHER): Payer: Medicare Other | Admitting: Family

## 2015-12-27 ENCOUNTER — Other Ambulatory Visit: Payer: Self-pay

## 2015-12-27 VITALS — BP 138/79 | HR 63 | Temp 97.1°F | Wt 197.0 lb

## 2015-12-27 DIAGNOSIS — I6529 Occlusion and stenosis of unspecified carotid artery: Secondary | ICD-10-CM

## 2015-12-27 DIAGNOSIS — Z9889 Other specified postprocedural states: Secondary | ICD-10-CM | POA: Diagnosis not present

## 2015-12-27 DIAGNOSIS — Z48812 Encounter for surgical aftercare following surgery on the circulatory system: Secondary | ICD-10-CM

## 2015-12-27 DIAGNOSIS — I6521 Occlusion and stenosis of right carotid artery: Secondary | ICD-10-CM

## 2015-12-27 LAB — VAS US CAROTID
LEFT ECA DIAS: -24 cm/s
LEFT VERTEBRAL DIAS: -8 cm/s
Left CCA dist dias: -20 cm/s
Left CCA dist sys: -77 cm/s
Left CCA prox dias: 15 cm/s
Left CCA prox sys: 103 cm/s
Left ICA dist dias: -17 cm/s
Left ICA dist sys: -53 cm/s
Left ICA prox dias: -18 cm/s
Left ICA prox sys: -90 cm/s
RIGHT CCA MID DIAS: -14 cm/s
RIGHT ECA DIAS: -20 cm/s
RIGHT VERTEBRAL DIAS: 14 cm/s
Right CCA prox dias: 13 cm/s
Right CCA prox sys: 82 cm/s
Right cca dist sys: -94 cm/s

## 2015-12-27 NOTE — Progress Notes (Signed)
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 male who initially saw Dr. Trula Slade in November of 2015 to establish continuity of vascular care. The patient has a history of a right carotid endarterectomy performed by Dr. Terence Lux and probably in 2004. 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 The patient was having dysarthria as well as left arm weakness. The patient has not had any symptoms of numbness or weakness, slurred speech, or amaurosis fugax, since his carotid endarterectomy.  The patient has a strong family history for cardiovascular issues and diabetes in his mother. The patient has never smoked. He is medically managed for hypercholesterolemia with a statin. He denies any cardiac history. He denies claudication symptoms.   For exercise he tries to run 3 days/week.  The patient denies New Medical or Surgical History.  Pt Diabetic: no Pt smoker: non-smoker. He was exposed to secondhand smoke from his father as a child.  Pt meds include: Statin : yes ASA: yes Other anticoagulants/antiplatelets: no     Past Medical History:  Diagnosis Date  . Hypercholesteremia   . Stroke [facility])     Social History Social History  Substance Use Topics  . Smoking status: Never Smoker  . Smokeless tobacco: Not on file  . Alcohol use 8.4 oz/week    7 Glasses of wine, 7 Shots of liquor per week     Comment: drinks mixed drinks and wine in the night.    Family History Family History  Problem Relation Age of Onset  . Stroke Mother   . Breast cancer Mother   . Dementia Mother   . CAD Mother   . Diabetes Mellitus II Mother   . Cancer Mother   . Diabetes Mother   . Heart disease Mother   . Hyperlipidemia Mother   . Hypertension Mother   . Hyperlipidemia Brother   . Hyperlipidemia Son     Surgical History Past Surgical History:  Procedure Laterality Date  . CAROTID ENDARTERECTOMY    .  CHOLECYSTECTOMY      No Known Allergies  Current Outpatient Prescriptions  Medication Sig Dispense Refill  . aspirin 325 MG EC tablet Take 325 mg by mouth daily.    Marland Kitchen lisinopril (PRINIVIL,ZESTRIL) 5 MG tablet Take 1 tablet (5 mg total) by mouth daily. 30 tablet 0  . losartan (COZAAR) 100 MG tablet   0  . simvastatin (ZOCOR) 20 MG tablet Take 20 mg by mouth at bedtime.    . Tetrahydrozoline HCl (VISINE OP) Place 2 drops into both eyes daily as needed (dry eye).    . Triamcinolone Acetonide (NASACORT ALLERGY 24HR NA) Place into the nose daily. 1 spray each nostril     No current facility-administered medications for this visit.     Review of Systems : See HPI for pertinent positives and negatives.  Physical Examination  Vitals:   12/27/15 0940  BP: 138/79  Pulse: 63  Temp: 97.1 F (36.2 C)  SpO2: 95%  Weight: 197 lb (89.4 kg)   Body mass index is 28.27 kg/m.  General: WDWN male in NAD GAIT: normal Eyes: PERRLA Pulmonary: Respirations are non-labored, CTAB, no rales,  rhonchi, or wheezing.  Cardiac: regular rhythm,  no detected murmur.  VASCULAR EXAM Carotid Bruits Right Left   Negative Negative    Aorta is not palpable. Radial pulses are 2+ palpable and equal.  LE Pulses Right Left       POPLITEAL  not palpable  not palpable       POSTERIOR TIBIAL  2+ palpable  2+ palpable       DORSALIS PEDIS      ANTERIOR TIBIAL 2+ palpable 2+ palpable    Gastrointestinal: soft, nontender, BS WNL, no r/g,  no palpable masses.  Musculoskeletal: No muscle atrophy/wasting. M/S 5/5 throughout, extremities without ischemic changes.  Neurologic: A&O X 3; Appropriate Affect, Speech is normal CN 2-12 intact, pain and light touch intact in extremities, Motor exam as listed above.    Assessment: Jesse Goodwin is a 70 y.o. male who had a  preoperative stroke. He had a right CEA in about 2004 or 2005 by a vascular surgeon in [place]. He moved to this area about 2014 and sees [place] for continuity of care. He has no residual left arm symptoms from his stroke and has had no subsequent strokes or TIA's.  His atherosclerotic risk factors are minimal.    DATA  Today's carotid duplex suggests <40% bilateral ICA stenoses. No significant stenosis of the bilateral ECA or CCA. Bilateral vertebral artery flow is antegrade.  Bilateral subclavian artery waveforms are normal.  No significant change compared to the last exam on 12-21-14.    Plan: Follow-up in 1 year with Carotid Duplex scan.   I discussed in depth with the patient the nature of atherosclerosis, and emphasized the importance of maximal medical management including strict control of blood pressure, blood glucose, and lipid levels, obtaining regular exercise, and continued cessation of smoking.  The patient is aware that without maximal medical management the underlying atherosclerotic disease process will progress, limiting the benefit of any interventions. The patient was given information about stroke prevention and what symptoms should prompt the patient to seek immediate medical care. Thank you for allowing [place] to participate in this patient's care.  Clemon Chambers, RN, MSN, FNP-C [facility] Office: [facility] Physician: Trula Slade  12/27/15 10:10 PM

## 2015-12-27 NOTE — Patient Instructions (Signed)
Stroke Prevention Some medical conditions and behaviors are associated with an increased chance of having a stroke. You may prevent a stroke by making healthy choices and managing medical conditions. How can I reduce my risk of having a stroke?  Stay physically active. Get at least 30 minutes of activity on most or all days.  Do not smoke. It may also be helpful to avoid exposure to secondhand smoke.  Limit alcohol use. Moderate alcohol use is considered to be:  No more than 2 drinks per day for men.  No more than 1 drink per day for nonpregnant women.  Eat healthy foods. This involves:  Eating 5 or more servings of fruits and vegetables a day.  Making dietary changes that address high blood pressure (hypertension), high cholesterol, diabetes, or obesity.  Manage your cholesterol levels.  Making food choices that are high in fiber and low in saturated fat, trans fat, and cholesterol may control cholesterol levels.  Take any prescribed medicines to control cholesterol as directed by your health care provider.  Manage your diabetes.  Controlling your carbohydrate and sugar intake is recommended to manage diabetes.  Take any prescribed medicines to control diabetes as directed by your health care provider.  Control your hypertension.  Making food choices that are low in salt (sodium), saturated fat, trans fat, and cholesterol is recommended to manage hypertension.  Ask your health care provider if you need treatment to lower your blood pressure. Take any prescribed medicines to control hypertension as directed by your health care provider.  If you are 18-39 years of age, have your blood pressure checked every 3-5 years. If you are 40 years of age or older, have your blood pressure checked every year.  Maintain a healthy weight.  Reducing calorie intake and making food choices that are low in sodium, saturated fat, trans fat, and cholesterol are recommended to manage  weight.  Stop drug abuse.  Avoid taking birth control pills.  Talk to your health care provider about the risks of taking birth control pills if you are over 35 years old, smoke, get migraines, or have ever had a blood clot.  Get evaluated for sleep disorders (sleep apnea).  Talk to your health care provider about getting a sleep evaluation if you snore a lot or have excessive sleepiness.  Take medicines only as directed by your health care provider.  For some people, aspirin or blood thinners (anticoagulants) are helpful in reducing the risk of forming abnormal blood clots that can lead to stroke. If you have the irregular heart rhythm of atrial fibrillation, you should be on a blood thinner unless there is a good reason you cannot take them.  Understand all your medicine instructions.  Make sure that other conditions (such as anemia or atherosclerosis) are addressed. Get help right away if:  You have sudden weakness or numbness of the face, arm, or leg, especially on one side of the body.  Your face or eyelid droops to one side.  You have sudden confusion.  You have trouble speaking (aphasia) or understanding.  You have sudden trouble seeing in one or both eyes.  You have sudden trouble walking.  You have dizziness.  You have a loss of balance or coordination.  You have a sudden, severe headache with no known cause.  You have new chest pain or an irregular heartbeat. Any of these symptoms may represent a serious problem that is an emergency. Do not wait to see if the symptoms will go away.   Get medical help at once. Call your local emergency services (911 in [place] Do not drive yourself to the hospital. This information is not intended to replace advice given to you by your health care provider. Make sure you discuss any questions you have with your health care provider. Document Released: 02/10/2004 Document Revised: 06/10/2015 Document Reviewed: 07/05/2012 Elsevier  Interactive Patient Education  2017 Elsevier Inc.  

## 2016-02-06 DIAGNOSIS — R05 Cough: Secondary | ICD-10-CM | POA: Diagnosis not present

## 2016-03-13 DIAGNOSIS — R972 Elevated prostate specific antigen [PSA]: Secondary | ICD-10-CM | POA: Diagnosis not present

## 2016-03-20 DIAGNOSIS — R361 Hematospermia: Secondary | ICD-10-CM | POA: Diagnosis not present

## 2016-03-20 DIAGNOSIS — R972 Elevated prostate specific antigen [PSA]: Secondary | ICD-10-CM | POA: Diagnosis not present

## 2016-05-05 DIAGNOSIS — R05 Cough: Secondary | ICD-10-CM | POA: Diagnosis not present

## 2016-05-05 DIAGNOSIS — J301 Allergic rhinitis due to pollen: Secondary | ICD-10-CM | POA: Diagnosis not present

## 2016-05-05 DIAGNOSIS — J3089 Other allergic rhinitis: Secondary | ICD-10-CM | POA: Diagnosis not present

## 2016-08-07 DIAGNOSIS — R7301 Impaired fasting glucose: Secondary | ICD-10-CM | POA: Diagnosis not present

## 2016-08-07 DIAGNOSIS — E291 Testicular hypofunction: Secondary | ICD-10-CM | POA: Diagnosis not present

## 2016-08-07 DIAGNOSIS — Z1389 Encounter for screening for other disorder: Secondary | ICD-10-CM | POA: Diagnosis not present

## 2016-08-07 DIAGNOSIS — E7800 Pure hypercholesterolemia, unspecified: Secondary | ICD-10-CM | POA: Diagnosis not present

## 2016-08-07 DIAGNOSIS — Z79899 Other long term (current) drug therapy: Secondary | ICD-10-CM | POA: Diagnosis not present

## 2016-08-07 DIAGNOSIS — R7303 Prediabetes: Secondary | ICD-10-CM | POA: Diagnosis not present

## 2016-08-07 DIAGNOSIS — R972 Elevated prostate specific antigen [PSA]: Secondary | ICD-10-CM | POA: Diagnosis not present

## 2016-08-07 DIAGNOSIS — N529 Male erectile dysfunction, unspecified: Secondary | ICD-10-CM | POA: Diagnosis not present

## 2016-08-07 DIAGNOSIS — I10 Essential (primary) hypertension: Secondary | ICD-10-CM | POA: Diagnosis not present

## 2016-08-07 DIAGNOSIS — Z7189 Other specified counseling: Secondary | ICD-10-CM | POA: Diagnosis not present

## 2016-08-07 DIAGNOSIS — Z0001 Encounter for general adult medical examination with abnormal findings: Secondary | ICD-10-CM | POA: Diagnosis not present

## 2016-08-14 DIAGNOSIS — E291 Testicular hypofunction: Secondary | ICD-10-CM | POA: Diagnosis not present

## 2016-08-14 DIAGNOSIS — E7800 Pure hypercholesterolemia, unspecified: Secondary | ICD-10-CM | POA: Diagnosis not present

## 2016-08-14 DIAGNOSIS — Z23 Encounter for immunization: Secondary | ICD-10-CM | POA: Diagnosis not present

## 2016-08-14 DIAGNOSIS — Z8673 Personal history of transient ischemic attack (TIA), and cerebral infarction without residual deficits: Secondary | ICD-10-CM | POA: Diagnosis not present

## 2016-08-14 DIAGNOSIS — J309 Allergic rhinitis, unspecified: Secondary | ICD-10-CM | POA: Diagnosis not present

## 2016-08-14 DIAGNOSIS — N529 Male erectile dysfunction, unspecified: Secondary | ICD-10-CM | POA: Diagnosis not present

## 2016-08-14 DIAGNOSIS — R972 Elevated prostate specific antigen [PSA]: Secondary | ICD-10-CM | POA: Diagnosis not present

## 2016-08-14 DIAGNOSIS — I10 Essential (primary) hypertension: Secondary | ICD-10-CM | POA: Diagnosis not present

## 2016-08-14 DIAGNOSIS — R7301 Impaired fasting glucose: Secondary | ICD-10-CM | POA: Diagnosis not present

## 2016-08-20 DIAGNOSIS — B029 Zoster without complications: Secondary | ICD-10-CM | POA: Diagnosis not present

## 2016-11-06 DIAGNOSIS — Z23 Encounter for immunization: Secondary | ICD-10-CM | POA: Diagnosis not present

## 2016-11-06 DIAGNOSIS — R972 Elevated prostate specific antigen [PSA]: Secondary | ICD-10-CM | POA: Diagnosis not present

## 2016-11-13 DIAGNOSIS — R361 Hematospermia: Secondary | ICD-10-CM | POA: Diagnosis not present

## 2016-11-13 DIAGNOSIS — R972 Elevated prostate specific antigen [PSA]: Secondary | ICD-10-CM | POA: Diagnosis not present

## 2016-12-11 DIAGNOSIS — H2513 Age-related nuclear cataract, bilateral: Secondary | ICD-10-CM | POA: Diagnosis not present

## 2016-12-11 DIAGNOSIS — H52203 Unspecified astigmatism, bilateral: Secondary | ICD-10-CM | POA: Diagnosis not present

## 2016-12-29 DIAGNOSIS — C61 Malignant neoplasm of prostate: Secondary | ICD-10-CM | POA: Diagnosis not present

## 2016-12-29 DIAGNOSIS — R972 Elevated prostate specific antigen [PSA]: Secondary | ICD-10-CM | POA: Diagnosis not present

## 2017-01-01 ENCOUNTER — Encounter (HOSPITAL_COMMUNITY): Payer: Medicare Other

## 2017-01-01 ENCOUNTER — Ambulatory Visit: Payer: Medicare Other | Admitting: Family

## 2017-01-12 DIAGNOSIS — C61 Malignant neoplasm of prostate: Secondary | ICD-10-CM | POA: Diagnosis not present

## 2017-01-16 HISTORY — PX: PROSTATE BIOPSY: SHX241

## 2017-02-01 ENCOUNTER — Encounter: Payer: Self-pay | Admitting: Radiation Oncology

## 2017-02-01 ENCOUNTER — Telehealth: Payer: Self-pay | Admitting: Radiation Oncology

## 2017-02-01 ENCOUNTER — Ambulatory Visit
Admission: RE | Admit: 2017-02-01 | Discharge: 2017-02-01 | Disposition: A | Discharge status: Home / Self Care | Payer: Medicare Other | Source: Ambulatory Visit | Attending: Radiation Oncology | Admitting: Radiation Oncology

## 2017-02-01 ENCOUNTER — Other Ambulatory Visit: Payer: Self-pay

## 2017-02-01 VITALS — BP 127/84 | HR 66 | Resp 18 | Ht 70.0 in | Wt 199.8 lb

## 2017-02-01 DIAGNOSIS — C61 Malignant neoplasm of prostate: Secondary | ICD-10-CM

## 2017-02-01 DIAGNOSIS — Z79899 Other long term (current) drug therapy: Secondary | ICD-10-CM | POA: Diagnosis not present

## 2017-02-01 DIAGNOSIS — Z8673 Personal history of transient ischemic attack (TIA), and cerebral infarction without residual deficits: Secondary | ICD-10-CM | POA: Diagnosis not present

## 2017-02-01 DIAGNOSIS — Z7982 Long term (current) use of aspirin: Secondary | ICD-10-CM | POA: Diagnosis not present

## 2017-02-01 DIAGNOSIS — E7800 Pure hypercholesterolemia, unspecified: Secondary | ICD-10-CM | POA: Insufficient documentation

## 2017-02-01 DIAGNOSIS — R972 Elevated prostate specific antigen [PSA]: Secondary | ICD-10-CM | POA: Diagnosis not present

## 2017-02-01 DIAGNOSIS — Z8249 Family history of ischemic heart disease and other diseases of the circulatory system: Secondary | ICD-10-CM | POA: Insufficient documentation

## 2017-02-01 HISTORY — DX: Malignant neoplasm of prostate: C61

## 2017-02-01 NOTE — Telephone Encounter (Signed)
Patient has not shown for 0900 appointment. Phoned patient to inquire. No answer. Left message with my contact information.

## 2017-02-01 NOTE — Progress Notes (Addendum)
GU Location of Tumor / Histology: prostatic adenocarcinoma  If Prostate Cancer, Gleason Score is (4 + 3) and PSA is (8.55). Prostate volume: 47.5 grams  Jesse Goodwin reports he was first told his PSA was elevated 18 months ago at a routine physical with Dr. Addison Lank. Patient was taking testosterone injections at that time. Patient reports Dr. Alyson Ingles rechecked his PSA 3 months later and his PSA had gone down but when PSA was assessed again 3 more months later by Dr. Alyson Ingles it was back up.    Biopsies of prostate (if applicable) revealed:    Past/Anticipated interventions by urology, if any: prostate biopsy, referral to radiation oncology  Past/Anticipated interventions by medical oncology, if any: no  Weight changes, if any: no  Bowel/Bladder complaints, if any: IPSS 1 with nocturia and ED. Denies dysuria, hematuria, leakage or incontinence.   Nausea/Vomiting, if any: no  Pain issues, if any:  no  SAFETY ISSUES:  Prior radiation? no  Pacemaker/ICD? no  Possible current pregnancy? no  Is the patient on methotrexate? no  Current Complaints / other details:  72 year old male. Married with one daughter and two sons.

## 2017-02-01 NOTE — Progress Notes (Signed)
Radiation Oncology         [phone] ________________________________  Initial outpatient Consultation  Name: Jesse Goodwin MRN: [id number]  Date: 02/01/2017  DOB: [birth year]  QQ:[id number], [name], MD  Alyson Ingles Candee Furbish, MD   REFERRING PHYSICIAN: Cleon Gustin, MD  DIAGNOSIS: 72 y.o. gentleman with Stage T1c adenocarcinoma of the prostate with Gleason Score of 4+3, and PSA of 8.55.    ICD-10-CM   1. Malignant neoplasm of prostate ([place]) C61     HISTORY OF PRESENT ILLNESS: Jesse Goodwin is a 72 y.o. male who is an established urology patient with a known history of hypogonadism treated with TRT since 01/2015. His PSA increased to 6.08 on 08/13/15 as compared to 3.73 1 year prior (after having been on TRT x 6 months). His PSA increased further to 6.86 on 11/08/15 and TRT was discontinued. It then decreased to 4.48 by 03/13/16. However, his PSA increased again to 8.55 by 11/06/16 despite being off TRT. Accordingly, he presented to Dr. Alyson Ingles on 12/29/16, digital rectal examination was performed at that time revealing symmetrical prostate lobes without nodularity.  The patient proceeded to transrectal ultrasound with 12 biopsies of the prostate on 01/12/17.  The prostate volume measured 47.5 cc.  Out of 12 core biopsies, 3 were positive.  The maximum Gleason score was 4+3, and this was seen in the left mid lateral with 3+3 in the left lateral base and right lateral base.  The patient reviewed the biopsy results with his urologist and he has kindly been referred today for discussion of potential radiation treatment options.  He is accompanied by his wife, Jesse Goodwin.  PREVIOUS RADIATION THERAPY: No  PAST MEDICAL HISTORY:  Past Medical History:  Diagnosis Date  . Hypercholesteremia   . Prostate cancer ([place])   . Stroke [facility])       PAST SURGICAL HISTORY: Past Surgical History:  Procedure Laterality Date  . CAROTID ENDARTERECTOMY    . CHOLECYSTECTOMY    . PROSTATE BIOPSY      FAMILY  HISTORY:  Family History  Problem Relation Age of Onset  . Stroke Mother   . Breast cancer Mother   . Dementia Mother   . CAD Mother   . Diabetes Mellitus II Mother   . Cancer Mother        breast  . Diabetes Mother   . Heart disease Mother   . Hyperlipidemia Mother   . Hypertension Mother   . Hyperlipidemia Brother   . Hyperlipidemia Son   . Stroke Sister     SOCIAL HISTORY:  Social History   Socioeconomic History  . Marital status: Married    Spouse name: Not on file  . Number of children: 3  . Years of education: Not on file  . Highest education level: Not on file  Social Needs  . Financial resource strain: Not on file  . Food insecurity - worry: Not on file  . Food insecurity - inability: Not on file  . Transportation needs - medical: Not on file  . Transportation needs - non-medical: Not on file  Occupational History  . Not on file  Tobacco Use  . Smoking status: Never Smoker  . Smokeless tobacco: Never Used  Substance and Sexual Activity  . Alcohol use: Yes    Alcohol/week: 8.4 oz    Types: 7 Glasses of wine, 7 Shots of liquor per week    Comment: drinks mixed drinks and wine in the night.  . Drug use: No  . Sexual activity:  Yes  Other Topics Concern  . Not on file  Social History Narrative  . Not on file    ALLERGIES: Patient has no known allergies.  MEDICATIONS:  Current Outpatient Medications  Medication Sig Dispense Refill  . amLODipine (NORVASC) 10 MG tablet Take 10 mg by mouth daily.  0  . aspirin 325 MG EC tablet Take 325 mg by mouth daily.    Marland Kitchen azelastine (ASTELIN) 0.1 % nasal spray   0  . lisinopril (PRINIVIL,ZESTRIL) 5 MG tablet Take 1 tablet (5 mg total) by mouth daily. 30 tablet 0  . losartan (COZAAR) 100 MG tablet   0  . simvastatin (ZOCOR) 20 MG tablet Take 20 mg by mouth at bedtime.    . Tetrahydrozoline HCl (VISINE OP) Place 2 drops into both eyes daily as needed (dry eye).    . Triamcinolone Acetonide (NASACORT ALLERGY 24HR NA)  Place into the nose daily. 1 spray each nostril    . FLUZONE HIGH-DOSE 0.5 ML injection inject 0.5 milliliter intramuscularly  0   No current facility-administered medications for this encounter.     REVIEW OF SYSTEMS:  On review of systems, the patient reports that he is doing well overall. He denies any chest pain, shortness of breath, cough, fevers, chills, night sweats, unintended weight changes. He denies any bowel disturbances, and denies abdominal pain, nausea or vomiting. He denies any new musculoskeletal or joint aches or pains. His IPSS was 1, indicating mild urinary symptoms. He is able to complete sexual activity with most attempts. A complete review of systems is obtained and is otherwise negative.    PHYSICAL EXAM:  Wt Readings from Last 3 Encounters:  02/01/17 199 lb 12.8 oz (90.6 kg)  12/27/15 197 lb (89.4 kg)  12/21/14 201 lb 4.8 oz (91.3 kg)   Temp Readings from Last 3 Encounters:  12/27/15 97.1 F (36.2 C)  11/29/12 98.3 F (36.8 C) (Oral)   BP Readings from Last 3 Encounters:  02/01/17 127/84  12/27/15 138/79  12/21/14 140/80   Pulse Readings from Last 3 Encounters:  02/01/17 66  12/27/15 63  12/21/14 67   Pain Assessment Pain Score: 0-No pain/10  In general this is a well appearing caucasian male in no acute distress. He is alert and oriented x4 and appropriate throughout the examination. HEENT reveals that the patient is normocephalic, atraumatic. EOMs are intact. PERRLA. Skin is intact without any evidence of gross lesions. Cardiovascular exam reveals a regular rate and rhythm, no clicks rubs or murmurs are auscultated. Chest is clear to auscultation bilaterally. Lymphatic assessment is performed and does not reveal any adenopathy in the cervical, supraclavicular, axillary, or inguinal chains. Abdomen has active bowel sounds in all quadrants and is intact. The abdomen is soft, non tender, non distended. Lower extremities are negative for pretibial pitting  edema, deep calf tenderness, cyanosis or clubbing.   KPS = 100  100 - Normal; no complaints; no evidence of disease. 90   - Able to carry on normal activity; minor signs or symptoms of disease. 80   - Normal activity with effort; some signs or symptoms of disease. 86   - Cares for self; unable to carry on normal activity or to do active work. 60   - Requires occasional assistance, but is able to care for most of his personal needs. 50   - Requires considerable assistance and frequent medical care. 41   - Disabled; requires special care and assistance. 30   - Severely disabled; hospital admission  is indicated although death not imminent. 52   - Very sick; hospital admission necessary; active supportive treatment necessary. 10   - Moribund; fatal processes progressing rapidly. 0     - Dead  Karnofsky DA, Abelmann [place], Craver LS and Burchenal [facility] [phone]) The use of the nitrogen mustards in the palliative treatment of carcinoma: with particular reference to bronchogenic carcinoma Cancer 1 634-56  LABORATORY DATA:  Lab Results  Component Value Date   WBC 5.9 11/28/2012   HGB 14.6 11/28/2012   HCT 41.5 11/28/2012   MCV 91.4 11/28/2012   PLT 162 11/28/2012   Lab Results  Component Value Date   NA 141 11/28/2012   K 3.3 (L) 11/28/2012   CL 104 11/28/2012   CO2 27 11/28/2012   Lab Results  Component Value Date   ALT 39 11/28/2012   AST 23 11/28/2012   ALKPHOS 41 11/28/2012   BILITOT 0.3 11/28/2012     RADIOGRAPHY: No results found.    IMPRESSION/PLAN: 1. 72 y.o. gentleman with Stage T1c adenocarcinoma of the prostate with Gleason Score of 4+3, and PSA of 8.55.  We discussed the patient's workup and outlined the nature of prostate cancer in this setting. The patient's T stage, Gleason's score, and PSA put him into the intermediate risk group. Accordingly, he is eligible for a variety of potential treatment options including brachytherapy, 8 weeks of external radiation or 5 weeks of  external radiation followed by a brachytherapy boost. We discussed the available radiation techniques, and focused on the details and logistics and delivery. We discussed and outlined the risks, benefits, short and long-term effects associated with radiotherapy and compared and contrasted these with prostatectomy. We discussed the role of SpaceOAR in reducing the rectal toxicity associated with radiotherapy.  The patient seems to be leaning towards brachytherapy but remains undecided at this time. We will plan to follow up with him by phone in the next 2-3 weeks or he will contact our office sooner once he has made a decision. Should he choose radiation therapy for management of his prostate cancer, we would be happy to participate in his care and will work to coordinate accordingly at that time.  He knows to call with any questions or concerns in the interim.  We enjoyed meeting him and his wife and look forward to participating in the care of this nice gentleman.  We spent 60 minutes minutes face to face with the patient and more than 50% of that time was spent in counseling and/or coordination of care.    Nicholos Johns, PA-C    Tyler Pita, MD  [facility] Direct Dial: [phone]  Fax: [phone] [email or web address]  Skype  [email or web address]  This document serves as a record of services personally performed by Tyler Pita, MD and Freeman Caldron, PA-C. It was created on their behalf by Linward Natal, a trained medical scribe. The creation of this record is based on the scribe's personal observations and the provider's statements to them. This document has been checked and approved by the attending provider.

## 2017-02-01 NOTE — Progress Notes (Signed)
See progress note under physician encounter. 

## 2017-02-02 DIAGNOSIS — C61 Malignant neoplasm of prostate: Secondary | ICD-10-CM | POA: Insufficient documentation

## 2017-02-09 ENCOUNTER — Telehealth: Payer: Self-pay | Admitting: *Deleted

## 2017-02-09 ENCOUNTER — Other Ambulatory Visit: Payer: Self-pay | Admitting: Urology

## 2017-02-09 NOTE — Telephone Encounter (Signed)
Called patient to inform of pre-seed planning CT and his implant, lvm for a return call 

## 2017-02-18 DIAGNOSIS — J069 Acute upper respiratory infection, unspecified: Secondary | ICD-10-CM | POA: Diagnosis not present

## 2017-02-20 ENCOUNTER — Other Ambulatory Visit: Payer: Self-pay | Admitting: Urology

## 2017-02-20 DIAGNOSIS — C61 Malignant neoplasm of prostate: Secondary | ICD-10-CM

## 2017-02-24 DIAGNOSIS — J209 Acute bronchitis, unspecified: Secondary | ICD-10-CM | POA: Diagnosis not present

## 2017-02-24 DIAGNOSIS — J069 Acute upper respiratory infection, unspecified: Secondary | ICD-10-CM | POA: Diagnosis not present

## 2017-02-24 DIAGNOSIS — R05 Cough: Secondary | ICD-10-CM | POA: Diagnosis not present

## 2017-03-08 ENCOUNTER — Telehealth: Payer: Self-pay | Admitting: *Deleted

## 2017-03-08 NOTE — Telephone Encounter (Signed)
CALLED PATIENT TO REMIND OF PRE-SEED APPTS, SPOKE WITH PATIENT AND HE IS AWARE OF THESE APPTS.

## 2017-03-09 ENCOUNTER — Encounter (HOSPITAL_COMMUNITY)
Admission: RE | Admit: 2017-03-09 | Discharge: 2017-03-09 | Disposition: A | Discharge status: Home / Self Care | Payer: Medicare Other | Source: Ambulatory Visit | Attending: Radiation Oncology | Admitting: Radiation Oncology

## 2017-03-09 ENCOUNTER — Ambulatory Visit (HOSPITAL_COMMUNITY)
Admission: RE | Admit: 2017-03-09 | Discharge: 2017-03-09 | Disposition: A | Discharge status: Home / Self Care | Payer: Medicare Other | Source: Ambulatory Visit | Attending: Urology | Admitting: Urology

## 2017-03-09 ENCOUNTER — Ambulatory Visit
Admission: RE | Admit: 2017-03-09 | Discharge: 2017-03-09 | Disposition: A | Discharge status: Home / Self Care | Payer: Medicare Other | Source: Ambulatory Visit | Attending: Radiation Oncology | Admitting: Radiation Oncology

## 2017-03-09 ENCOUNTER — Encounter: Payer: Self-pay | Admitting: Medical Oncology

## 2017-03-09 ENCOUNTER — Encounter (INDEPENDENT_AMBULATORY_CARE_PROVIDER_SITE_OTHER): Payer: Self-pay

## 2017-03-09 DIAGNOSIS — J9811 Atelectasis: Secondary | ICD-10-CM | POA: Insufficient documentation

## 2017-03-09 DIAGNOSIS — Z510 Encounter for antineoplastic radiation therapy: Secondary | ICD-10-CM | POA: Insufficient documentation

## 2017-03-09 DIAGNOSIS — C61 Malignant neoplasm of prostate: Secondary | ICD-10-CM | POA: Diagnosis not present

## 2017-03-09 DIAGNOSIS — Z01810 Encounter for preprocedural cardiovascular examination: Secondary | ICD-10-CM | POA: Insufficient documentation

## 2017-03-09 DIAGNOSIS — Z01818 Encounter for other preprocedural examination: Secondary | ICD-10-CM

## 2017-03-09 NOTE — Progress Notes (Signed)
Introduced myself to Jesse Goodwin as the prostate nurse navigator and my role. He states he was eligible for surgical or radiation options. He has chosen brachytherapy. He is scheduled for surgery 04/30/17. I asked him to call with questions or concerns.

## 2017-03-09 NOTE — Progress Notes (Signed)
  Radiation Oncology         [phone] ________________________________  Name: Jesse Goodwin MRN: [id number]  Date: 03/09/2017  DOB: February 01, 1945  SIMULATION AND TREATMENT PLANNING NOTE PUBIC ARCH STUDY  [id number], [name], MD  Cari Caraway, MD  DIAGNOSIS: 72 y.o. gentleman with Stage T1c adenocarcinoma of the prostate with Gleason Score of 4+3, and PSA of 8.55     ICD-10-CM   1. Malignant neoplasm of prostate ([place]) C61     COMPLEX SIMULATION:  The patient presented today for evaluation for possible prostate seed implant. He was brought to the radiation planning suite and placed supine on the CT couch. A 3-dimensional image study set was obtained in upload to the planning computer. There, on each axial slice, I contoured the prostate gland. Then, using three-dimensional radiation planning tools I reconstructed the prostate in view of the structures from the transperineal needle pathway to assess for possible pubic arch interference. In doing so, I did not appreciate any pubic arch interference. Also, the patient's prostate volume was estimated based on the drawn structure. The volume was 38 cc.  Given the pubic arch appearance and prostate volume, patient remains a good candidate to proceed with prostate seed implant. Today, he freely provided informed written consent to proceed.    PLAN: The patient will undergo prostate seed implant.   ________________________________  Sheral Apley. Tammi Klippel, M.D.

## 2017-04-20 ENCOUNTER — Encounter (HOSPITAL_BASED_OUTPATIENT_CLINIC_OR_DEPARTMENT_OTHER): Payer: Self-pay | Admitting: Emergency Medicine

## 2017-04-20 ENCOUNTER — Other Ambulatory Visit: Payer: Self-pay

## 2017-04-20 NOTE — Progress Notes (Signed)
SPOKE WITH: PATIENT   RIDING HOME WITH: WIFE SUSAN [phone] AM MEDICATIONS: AMLODIPINE, LOSARTAN , NASAL SPRAY , EYE DROPS NPO STATUS: NPO SOLIDS AFTER MIDNIGHT, CLEAR LIQUIDS ONLY  UNTIL 8 AM DAY OF SURGERY . COMPLETE FLEETS ENEMA AM OF SURGERY  LABS: EKG AND CXR CURRENT IN Epic. APPT FOR LABS ON 04-23-17 TO COMPLETE CBC,CMP, PT, APTT, HGBA1C COMMENTS/CONCERNS: PATIENT CONFIRMS HE HAS WRITTEN INTSRUCTIONS FROM [facility] ON HOLDING HIS ASPIRIN 325 FOR SURGERY.

## 2017-04-20 NOTE — Progress Notes (Addendum)
Shihab States     Your procedure is scheduled on: 04-30-17  Report to [facility]  at  12:00PM.  Call this number if you have problems the morning of surgery:[phone]  OUR ADDRESS IS [place] WITH [facility].   Remember:  Do not eat food or drink liquids after midnight. YOU MAY HAVE CLEAR LIQUIDS FROM MIDNIGHT UNTIL 8AM DAY OF SURGERY. NOTHING BY MOUTH AFTER 8AM!   Take these medicines the morning of surgery with A SIP OF WATER: AMLODIPINE, LOSARTAN, NASAL SPRAY, EYE DROPS IF NEEDED  PLEASE DO YOUR FLEETS ENEMA THE MORNING OF SURGERY!  Do not wear jewelry, make-up or nail polish. Do not wear lotions, powders, or perfumes, or deoderant. Do not shave 48 hours prior to surgery.  Men may shave face and neck. Do not bring valuables to the hospital. [facility] is not responsible for any belongings or valuables.  Contacts, dentures or bridgework may not be worn into surgery.  Leave your suitcase in the car.  After surgery it may be brought to your room.  For patients admitted to the hospital, discharge time will be determined by your treatment team.  Patients discharged the day of surgery will not be allowed to drive home.   Special instructions:  n/a  Please read over the following fact sheets that you were given:       [place] Allowed                                                                     Foods Excluded  Coffee and tea, regular and decaf                             liquids that you cannot  Plain Jell-O in any flavor                                             see through such as: Fruit ices (not with fruit pulp)                                     milk, soups, orange juice  Iced Popsicles                                    All solid food Carbonated beverages, regular and diet                                    Cranberry, grape and apple juices Sports drinks  like Gatorade Lightly seasoned clear broth or consume(fat free) Sugar, honey syrup  Sample Menu Breakfast  Lunch                                     Supper Cranberry juice                    Beef broth                            Chicken broth Jell-O                                     Grape juice                           Apple juice Coffee or tea                        Jell-O                                      Popsicle                                                Coffee or tea                        Coffee or tea   FAILURE TO FOLLOW THESE INSTRUCTIONS MAY RESULT IN THE CANCELLATION OF YOUR SURGERY PATIENT SIGNATURE_________________________________  NURSE SIGNATURE__________________________________  ________________________________________________________________________

## 2017-04-23 ENCOUNTER — Encounter (HOSPITAL_COMMUNITY)
Admission: RE | Admit: 2017-04-23 | Discharge: 2017-04-23 | Disposition: A | Discharge status: Home / Self Care | Payer: Medicare Other | Source: Ambulatory Visit | Attending: Urology | Admitting: Urology

## 2017-04-23 DIAGNOSIS — Z01812 Encounter for preprocedural laboratory examination: Secondary | ICD-10-CM | POA: Diagnosis not present

## 2017-04-23 LAB — COMPREHENSIVE METABOLIC PANEL
ALT: 35 U/L (ref 17–63)
AST: 25 U/L (ref 15–41)
Albumin: 4.1 g/dL (ref 3.5–5.0)
Alkaline Phosphatase: 48 U/L (ref 38–126)
Anion gap: 10 (ref 5–15)
BUN: 27 mg/dL — ABNORMAL HIGH (ref 6–20)
CO2: 23 mmol/L (ref 22–32)
Calcium: 9.6 mg/dL (ref 8.9–10.3)
Chloride: 106 mmol/L (ref 101–111)
Creatinine, Ser: 0.99 mg/dL (ref 0.61–1.24)
GFR calc Af Amer: >60 mL/min (ref >60)
GFR calc non Af Amer: >60 mL/min (ref >60)
Glucose, Bld: 128 mg/dL — ABNORMAL HIGH (ref 65–99)
Potassium: 4.4 mmol/L (ref 3.5–5.1)
Sodium: 139 mmol/L (ref 135–145)
Total Bilirubin: 1.1 mg/dL (ref 0.3–1.2)
Total Protein: 7.3 g/dL (ref 6.5–8.1)

## 2017-04-23 LAB — CBC
HCT: 45.1 % (ref 39.0–52.0)
Hemoglobin: 15.1 g/dL (ref 13.0–17.0)
MCH: 31.4 pg (ref 26.0–34.0)
MCHC: 33.5 g/dL (ref 30.0–36.0)
MCV: 93.8 fL (ref 78.0–100.0)
Platelets: 196 K/uL (ref 150–400)
RBC: 4.81 MIL/uL (ref 4.22–5.81)
RDW: 12.9 % (ref 11.5–15.5)
WBC: 5.6 K/uL (ref 4.0–10.5)

## 2017-04-23 LAB — APTT: aPTT: 28 seconds (ref 24–36)

## 2017-04-23 LAB — PROTIME-INR
INR: 1.06
Prothrombin Time: 13.7 seconds (ref 11.4–15.2)

## 2017-04-23 NOTE — Progress Notes (Signed)
A1c not needed for [facility] pt. , d/c'd order. Pt did arrive for other lab work to be done.

## 2017-04-24 DIAGNOSIS — C61 Malignant neoplasm of prostate: Secondary | ICD-10-CM | POA: Diagnosis not present

## 2017-04-26 DIAGNOSIS — C61 Malignant neoplasm of prostate: Secondary | ICD-10-CM | POA: Diagnosis not present

## 2017-04-27 ENCOUNTER — Telehealth: Payer: Self-pay | Admitting: *Deleted

## 2017-04-27 NOTE — Telephone Encounter (Signed)
Called patient to remind of procedure for 04-30-17, spoke with patient and he is aware of this procedure

## 2017-04-30 ENCOUNTER — Encounter (HOSPITAL_BASED_OUTPATIENT_CLINIC_OR_DEPARTMENT_OTHER): Payer: Self-pay

## 2017-04-30 ENCOUNTER — Ambulatory Visit (HOSPITAL_BASED_OUTPATIENT_CLINIC_OR_DEPARTMENT_OTHER): Payer: Medicare Other | Admitting: Anesthesiology

## 2017-04-30 ENCOUNTER — Other Ambulatory Visit: Payer: Self-pay

## 2017-04-30 ENCOUNTER — Encounter (HOSPITAL_BASED_OUTPATIENT_CLINIC_OR_DEPARTMENT_OTHER)
Admission: RE | Disposition: A | Discharge status: Home / Self Care | Payer: Self-pay | Source: Ambulatory Visit | Attending: Urology

## 2017-04-30 ENCOUNTER — Ambulatory Visit (HOSPITAL_COMMUNITY): Payer: Medicare Other

## 2017-04-30 ENCOUNTER — Ambulatory Visit (HOSPITAL_BASED_OUTPATIENT_CLINIC_OR_DEPARTMENT_OTHER)
Admission: RE | Admit: 2017-04-30 | Discharge: 2017-04-30 | Disposition: A | Discharge status: Home / Self Care | Payer: Medicare Other | Source: Ambulatory Visit | Attending: Urology | Admitting: Urology

## 2017-04-30 DIAGNOSIS — C61 Malignant neoplasm of prostate: Secondary | ICD-10-CM | POA: Diagnosis not present

## 2017-04-30 DIAGNOSIS — Z9049 Acquired absence of other specified parts of digestive tract: Secondary | ICD-10-CM | POA: Diagnosis not present

## 2017-04-30 DIAGNOSIS — Z803 Family history of malignant neoplasm of breast: Secondary | ICD-10-CM | POA: Diagnosis not present

## 2017-04-30 DIAGNOSIS — Z820 Family history of epilepsy and other diseases of the nervous system: Secondary | ICD-10-CM | POA: Insufficient documentation

## 2017-04-30 DIAGNOSIS — Z833 Family history of diabetes mellitus: Secondary | ICD-10-CM | POA: Insufficient documentation

## 2017-04-30 DIAGNOSIS — R7303 Prediabetes: Secondary | ICD-10-CM | POA: Insufficient documentation

## 2017-04-30 DIAGNOSIS — Z8249 Family history of ischemic heart disease and other diseases of the circulatory system: Secondary | ICD-10-CM | POA: Insufficient documentation

## 2017-04-30 DIAGNOSIS — Z79899 Other long term (current) drug therapy: Secondary | ICD-10-CM | POA: Insufficient documentation

## 2017-04-30 DIAGNOSIS — Z8673 Personal history of transient ischemic attack (TIA), and cerebral infarction without residual deficits: Secondary | ICD-10-CM | POA: Insufficient documentation

## 2017-04-30 DIAGNOSIS — E7800 Pure hypercholesterolemia, unspecified: Secondary | ICD-10-CM | POA: Insufficient documentation

## 2017-04-30 DIAGNOSIS — Z823 Family history of stroke: Secondary | ICD-10-CM | POA: Insufficient documentation

## 2017-04-30 DIAGNOSIS — I10 Essential (primary) hypertension: Secondary | ICD-10-CM | POA: Insufficient documentation

## 2017-04-30 DIAGNOSIS — I639 Cerebral infarction, unspecified: Secondary | ICD-10-CM | POA: Diagnosis not present

## 2017-04-30 DIAGNOSIS — E785 Hyperlipidemia, unspecified: Secondary | ICD-10-CM | POA: Diagnosis not present

## 2017-04-30 HISTORY — PX: RADIOACTIVE SEED IMPLANT: SHX5150

## 2017-04-30 HISTORY — PX: SPACE OAR INSTILLATION: SHX6769

## 2017-04-30 HISTORY — DX: Essential (primary) hypertension: I10

## 2017-04-30 HISTORY — DX: Prediabetes: R73.03

## 2017-04-30 SURGERY — INSERTION, RADIATION SOURCE, PROSTATE
Anesthesia: General

## 2017-04-30 MED ORDER — TRAMADOL HCL 50 MG PO TABS: 50.0000 mg | ORAL_TABLET | Freq: Four times a day (QID) | ORAL | 0 refills | Status: AC | PRN

## 2017-04-30 MED ORDER — KETOROLAC TROMETHAMINE 30 MG/ML IJ SOLN
INTRAMUSCULAR | Status: DC | PRN
  Administered 2017-04-30: 30 mg via INTRAVENOUS

## 2017-04-30 MED ORDER — OXYCODONE HCL 5 MG/5ML PO SOLN
5.0000 mg | Freq: Once | ORAL | Status: AC | PRN
  Filled 2017-04-30: qty 5

## 2017-04-30 MED ORDER — FENTANYL CITRATE (PF) 100 MCG/2ML IJ SOLN
25.0000 ug | INTRAMUSCULAR | Status: DC | PRN
  Administered 2017-04-30 (×2): 25 ug via INTRAVENOUS
  Administered 2017-04-30: 50 ug via INTRAVENOUS
  Filled 2017-04-30: qty 1

## 2017-04-30 MED ORDER — OXYCODONE HCL 5 MG PO TABS
ORAL_TABLET | ORAL | Status: AC
  Filled 2017-04-30: qty 1

## 2017-04-30 MED ORDER — FENTANYL CITRATE (PF) 100 MCG/2ML IJ SOLN
INTRAMUSCULAR | Status: DC | PRN
  Administered 2017-04-30 (×4): 25 ug via INTRAVENOUS

## 2017-04-30 MED ORDER — LIDOCAINE 2% (20 MG/ML) 5 ML SYRINGE
INTRAMUSCULAR | Status: AC
  Filled 2017-04-30: qty 5

## 2017-04-30 MED ORDER — FENTANYL CITRATE (PF) 100 MCG/2ML IJ SOLN
INTRAMUSCULAR | Status: AC
  Filled 2017-04-30: qty 2

## 2017-04-30 MED ORDER — SODIUM CHLORIDE 0.9 % IV SOLN
INTRAVENOUS | Status: AC | PRN
  Administered 2017-04-30: 1000 mL via INTRAMUSCULAR

## 2017-04-30 MED ORDER — OXYCODONE HCL 5 MG PO TABS
5.0000 mg | ORAL_TABLET | Freq: Once | ORAL | Status: AC | PRN
  Administered 2017-04-30: 5 mg via ORAL
  Filled 2017-04-30: qty 1

## 2017-04-30 MED ORDER — DEXAMETHASONE SODIUM PHOSPHATE 4 MG/ML IJ SOLN
INTRAMUSCULAR | Status: DC | PRN
  Administered 2017-04-30: 10 mg via INTRAVENOUS

## 2017-04-30 MED ORDER — PROMETHAZINE HCL 25 MG/ML IJ SOLN
6.2500 mg | INTRAMUSCULAR | Status: DC | PRN
  Filled 2017-04-30: qty 1

## 2017-04-30 MED ORDER — ONDANSETRON HCL 4 MG/2ML IJ SOLN
INTRAMUSCULAR | Status: DC | PRN
  Administered 2017-04-30: 4 mg via INTRAVENOUS

## 2017-04-30 MED ORDER — EPHEDRINE 5 MG/ML INJ
INTRAVENOUS | Status: AC
  Filled 2017-04-30: qty 10

## 2017-04-30 MED ORDER — IOHEXOL 300 MG/ML  SOLN
INTRAMUSCULAR | Status: DC | PRN
  Administered 2017-04-30: 7 mL

## 2017-04-30 MED ORDER — GENTAMICIN SULFATE 40 MG/ML IJ SOLN
400.0000 mg | INTRAVENOUS | Status: AC
  Administered 2017-04-30: 400 mg via INTRAVENOUS
  Filled 2017-04-30: qty 10

## 2017-04-30 MED ORDER — FENTANYL CITRATE (PF) 100 MCG/2ML IJ SOLN
INTRAMUSCULAR | Status: AC
Start: 2017-04-30 — End: 2017-04-30
  Filled 2017-04-30: qty 2

## 2017-04-30 MED ORDER — SODIUM CHLORIDE 0.9 % IJ SOLN
INTRAMUSCULAR | Status: DC | PRN
  Administered 2017-04-30: 10 mL via INTRAVENOUS

## 2017-04-30 MED ORDER — PROPOFOL 10 MG/ML IV BOLUS
INTRAVENOUS | Status: DC | PRN
  Administered 2017-04-30: 200 mg via INTRAVENOUS

## 2017-04-30 MED ORDER — DEXAMETHASONE SODIUM PHOSPHATE 10 MG/ML IJ SOLN
INTRAMUSCULAR | Status: AC
  Filled 2017-04-30: qty 1

## 2017-04-30 MED ORDER — PROPOFOL 10 MG/ML IV BOLUS
INTRAVENOUS | Status: AC
  Filled 2017-04-30: qty 40

## 2017-04-30 MED ORDER — ONDANSETRON HCL 4 MG/2ML IJ SOLN
INTRAMUSCULAR | Status: AC
  Filled 2017-04-30: qty 2

## 2017-04-30 MED ORDER — LACTATED RINGERS IV SOLN
INTRAVENOUS | Status: DC
  Administered 2017-04-30 (×2): via INTRAVENOUS
  Filled 2017-04-30: qty 1000

## 2017-04-30 MED ORDER — ACETAMINOPHEN 10 MG/ML IV SOLN
1000.0000 mg | Freq: Once | INTRAVENOUS | Status: DC | PRN
Start: 2017-04-30 — End: 2017-04-30
  Filled 2017-04-30: qty 100

## 2017-04-30 MED ORDER — EPHEDRINE SULFATE 50 MG/ML IJ SOLN
INTRAMUSCULAR | Status: DC | PRN
  Administered 2017-04-30 (×2): 10 mg via INTRAVENOUS

## 2017-04-30 MED ORDER — FLEET ENEMA 7-19 GM/118ML RE ENEM
1.0000 | ENEMA | Freq: Once | RECTAL | Status: DC
  Filled 2017-04-30: qty 1

## 2017-04-30 MED ORDER — LIDOCAINE HCL (CARDIAC) 20 MG/ML IV SOLN
INTRAVENOUS | Status: DC | PRN
  Administered 2017-04-30: 100 mg via INTRAVENOUS

## 2017-04-30 MED ORDER — GENTAMICIN SULFATE 40 MG/ML IJ SOLN
5.0000 mg/kg | Freq: Once | INTRAVENOUS | Status: DC
  Filled 2017-04-30: qty 11.25

## 2017-04-30 SURGICAL SUPPLY — 29 items
BAG URINE DRAINAGE (UROLOGICAL SUPPLIES) ×2 IMPLANT
BLADE CLIPPER SURG (BLADE) ×2 IMPLANT
CATH FOLEY 2WAY SLVR  5CC 16FR (CATHETERS) ×2
CATH FOLEY 2WAY SLVR 5CC 16FR (CATHETERS) ×2 IMPLANT
CATH ROBINSON RED A/P 20FR (CATHETERS) ×2 IMPLANT
CLOTH BEACON ORANGE TIMEOUT ST (SAFETY) ×2 IMPLANT
COVER BACK TABLE 60X90IN (DRAPES) ×2 IMPLANT
COVER MAYO STAND STRL (DRAPES) ×2 IMPLANT
DRSG TEGADERM 4X4.75 (GAUZE/BANDAGES/DRESSINGS) ×2 IMPLANT
DRSG TEGADERM 8X12 (GAUZE/BANDAGES/DRESSINGS) ×4 IMPLANT
GAUZE SPONGE 4X4 12PLY STRL (GAUZE/BANDAGES/DRESSINGS) ×2 IMPLANT
GLOVE BIO SURGEON STRL SZ8 (GLOVE) ×2 IMPLANT
GLOVE ECLIPSE 8.0 STRL XLNG CF (GLOVE) ×2 IMPLANT
GOWN STRL REUS W/TWL XL LVL3 (GOWN DISPOSABLE) ×2 IMPLANT
HOLDER FOLEY CATH W/STRAP (MISCELLANEOUS) ×2 IMPLANT
IMPL SPACEOAR SYSTEM 10ML (MISCELLANEOUS) IMPLANT
IMPLANT SPACEOAR SYSTEM 10ML (MISCELLANEOUS)
IV NS 1000ML (IV SOLUTION) ×1
IV NS 1000ML BAXH (IV SOLUTION) ×1 IMPLANT
KIT TURNOVER CYSTO (KITS) ×2 IMPLANT
MANIFOLD NEPTUNE II (INSTRUMENTS) IMPLANT
PACK CYSTO (CUSTOM PROCEDURE TRAY) ×2 IMPLANT
SELECTSEED I-125 ×2 IMPLANT
SURGILUBE 2OZ TUBE FLIPTOP (MISCELLANEOUS) ×2 IMPLANT
SUT BONE WAX W31G (SUTURE) ×2 IMPLANT
SYRINGE 10CC LL (SYRINGE) ×4 IMPLANT
UNDERPAD 30X30 (UNDERPADS AND DIAPERS) ×4 IMPLANT
WATER STERILE IRR 3000ML UROMA (IV SOLUTION) ×2 IMPLANT
WATER STERILE IRR 500ML POUR (IV SOLUTION) ×2 IMPLANT

## 2017-04-30 NOTE — Anesthesia Preprocedure Evaluation (Signed)
Anesthesia Evaluation  Patient identified by MRN, date of birth, ID band Patient awake    Reviewed: Allergy & Precautions, NPO status , Patient's Chart, lab work & pertinent test results  Airway Mallampati: II  TM Distance: >3 FB Neck ROM: Full    Dental no notable dental hx.    Pulmonary neg pulmonary ROS,    Pulmonary exam normal breath sounds clear to auscultation       Cardiovascular hypertension, Normal cardiovascular exam Rhythm:Regular Rate:Normal     Neuro/Psych CVA negative psych ROS   GI/Hepatic negative GI ROS, (+)     substance abuse  alcohol use,   Endo/Other  negative endocrine ROS  Renal/GU negative Renal ROS  negative genitourinary   Musculoskeletal negative musculoskeletal ROS (+)   Abdominal   Peds negative pediatric ROS (+)  Hematology negative hematology ROS (+)   Anesthesia Other Findings   Reproductive/Obstetrics negative OB ROS                             Anesthesia Physical Anesthesia Plan  ASA: III  Anesthesia Plan: General   Post-op Pain Management:    Induction: Intravenous  PONV Risk Score and Plan: 2 and Ondansetron, Dexamethasone and Treatment may vary due to age or medical condition  Airway Management Planned: LMA  Additional Equipment:   Intra-op Plan:   Post-operative Plan: Extubation in OR  Informed Consent: I have reviewed the patients History and Physical, chart, labs and discussed the procedure including the risks, benefits and alternatives for the proposed anesthesia with the patient or authorized representative who has indicated his/her understanding and acceptance.   Dental advisory given  Plan Discussed with: CRNA and Surgeon  Anesthesia Plan Comments:         Anesthesia Quick Evaluation

## 2017-04-30 NOTE — H&P (Signed)
Urology Admission H&P  Chief Complaint: prostate cancer  History of Present Illness: Mr Jesse Goodwin is a 72yo with a hx of prostate cancer here for definitive therapy. He denies any significant LUTS. No fevers/chills/sweats. No nausea/vomiting  Past Medical History:  Diagnosis Date  . Hypercholesteremia   . Hypertension   . Pre-diabetes    pre-dm   . Prostate cancer ([place])   . 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 CAROTID ENDARTERECTOMY     2014 FOR TREATMENT OF STROKE   . CHOLECYSTECTOMY     living in [place] at this time, does not remember when or who did it   . PROSTATE BIOPSY  2019    Home Medications:  Current Facility-Administered Medications  Medication Dose Route Frequency Provider Last Rate Last Dose  . gentamicin (GARAMYCIN) 400 mg in dextrose 5 % 100 mL IVPB  400 mg Intravenous On Call Polly Cobia, RPH      . lactated ringers infusion   Intravenous Continuous Myrtie Soman, MD 50 mL/hr at 04/30/17 1249    . [START ON 05/01/2017] sodium phosphate (FLEET) 7-19 GM/118ML enema 1 enema  1 enema Rectal Once McKenzie, Candee Furbish, MD       Allergies: No Known Allergies  Family History  Problem Relation Age of Onset  . Stroke Mother   . Breast cancer Mother   . Dementia Mother   . CAD Mother   . Diabetes Mellitus II Mother   . Cancer Mother        breast  . Diabetes Mother   . Heart disease Mother   . Hyperlipidemia Mother   . Hypertension Mother   . Hyperlipidemia Brother   . Hyperlipidemia Son   . Stroke Sister    Social History:  reports that he has never smoked. He has never used smokeless tobacco. He reports that he drinks about 8.4 oz of alcohol per week. He reports that he does not use drugs.  Review of Systems  All other systems reviewed and are negative.   Physical Exam:  Vital signs in last 24 hours: Temp:  [97.8 F (36.6 C)] 97.8 F (36.6 C) (04/15 1209) Pulse Rate:  [64] 64  (04/15 1209) Resp:  [16] 16 (04/15 1209) BP: (135)/(69) 135/69 (04/15 1209) SpO2:  [98 %] 98 % (04/15 1209) Weight:  [88.8 kg (195 lb 12.8 oz)] 88.8 kg (195 lb 12.8 oz) (04/15 1209) Physical Exam  Constitutional: He is oriented to person, place, and time. He appears well-developed and well-nourished.  HENT:  Head: Normocephalic and atraumatic.  Eyes: Pupils are equal, round, and reactive to light. EOM are normal.  Neck: Normal range of motion. No thyromegaly present.  Cardiovascular: Normal rate and regular rhythm.  Respiratory: Effort normal. No respiratory distress.  GI: Soft. He exhibits no distension.  Musculoskeletal: Normal range of motion. He exhibits no edema.  Neurological: He is alert and oriented to person, place, and time.  Skin: Skin is warm and dry.  Psychiatric: He has a normal mood and affect. His behavior is normal. Judgment and thought content normal.    Laboratory Data:  No results found for this or any previous visit (from the past 24 hour(s)). No results found for this or any previous visit (from the past 240 hour(s)). Creatinine: No results for input(s): CREATININE in the last 168 hours. Baseline Creatinine: unknwon  Impression/Assessment:  72yo with T1c prostate cancer  Plan:  The risks/benefits/alternatives to brachytherapy with SpaceOAR placement was explained to the patient and he understands and wishes to proceed with surgery  Nicolette Bang 04/30/2017, 1:25 PM

## 2017-04-30 NOTE — Anesthesia Procedure Notes (Signed)
Procedure Name: LMA Insertion Date/Time: 04/30/2017 2:18 PM Performed by: Justice Rocher, CRNA Pre-anesthesia Checklist: Patient identified, Emergency Drugs available, Suction available and Patient being monitored Patient Re-evaluated:Patient Re-evaluated prior to induction Oxygen Delivery Method: Circle system utilized Preoxygenation: Pre-oxygenation with 100% oxygen Induction Type: IV induction Ventilation: Mask ventilation without difficulty LMA: LMA inserted LMA Size: 4.0 Number of attempts: 1 Airway Equipment and Method: Bite block Placement Confirmation: positive ETCO2 and breath sounds checked- equal and bilateral Tube secured with: Tape Dental Injury: Teeth and Oropharynx as per pre-operative assessment

## 2017-04-30 NOTE — Anesthesia Postprocedure Evaluation (Signed)
Anesthesia Post Note  Patient: [place] Luppino  Procedure(s) Performed: RADIOACTIVE SEED IMPLANT/BRACHYTHERAPY IMPLANT (N/A ) SPACE OAR INSTILLATION (N/A )     Patient location during evaluation: PACU Anesthesia Type: General Level of consciousness: awake and alert Pain management: pain level controlled Vital Signs Assessment: post-procedure vital signs reviewed and stable Respiratory status: spontaneous breathing, nonlabored ventilation, respiratory function stable and patient connected to nasal cannula oxygen Cardiovascular status: blood pressure returned to baseline and stable Postop Assessment: no apparent nausea or vomiting Anesthetic complications: no    Last Vitals:  Vitals:   04/30/17 1543 04/30/17 1545  BP: 126/66 126/66  Pulse: 72 73  Resp: 10 13  Temp: 36.6 C   SpO2: 100% 100%    Last Pain:  Vitals:   04/30/17 1545  TempSrc:   PainSc: 8                  JOSLIN,DAVID COKER

## 2017-04-30 NOTE — Op Note (Signed)
PRE-OPERATIVE DIAGNOSIS:  Adenocarcinoma of the prostate  POST-OPERATIVE DIAGNOSIS:  Same  PROCEDURE:  Procedure(s): 1. I-125 radioactive seed implantation 2. Cystoscopy 3. Placement of SpaceOAR  SURGEON:  Surgeon(s): Nicolette Bang, MD  Radiation oncologist: Tyler Pita, MD  ANESTHESIA:  General  EBL:  Minimal  DRAINS: 72 French Foley catheter  INDICATION: Jesse Goodwin is a 72 year old with a history of T1c prostate cancer. After discussing treatment options he has elected to proceed with brachytherapy  Description of procedure: After informed consent the patient was brought to the major OR, placed on the table and administered general anesthesia. He was then moved to the modified lithotomy position with his perineum perpendicular to the floor. His perineum and genitalia were then sterilely prepped. An official timeout was then performed. A 16 French Foley catheter was then placed in the bladder and filled with dilute contrast, a rectal tube was placed in the rectum and the transrectal ultrasound probe was placed in the rectum and affixed to the stand. He was then sterilely draped.  Real time ultrasonography was used along with the seed planning software [employer] Prostate vs. 4.2.21. This was used to develop the seed plan including the number of needles as well as number of seeds required for complete and adequate coverage. Real-time ultrasonography was then used along with the previously developed plan and the Nucletron device to implant a total of 77 seeds using 24 needles. This proceeded without difficulty or complication.  We then proceeded to mix the SpaceOAR using the kit supplied from the manufacturer. Once this was complete we placed a sinal needle into the perirectal fat between the rectum and the prostate. Once this was accomplished we injected 2cc of normal saline to hydrodissect the plain. We then instilled the the SpaceOAR through the spinal needle and noted good  distribution in the perirectal fat.   The Foley catheter was then removed as well as the transrectal ultrasound probe and rectal probe. Flexible cystoscopy was then performed using the 17 French flexible scope which revealed a normal urethra throughout its length down to the sphincter which appeared intact. The prostatic urethra revealed bilobar hypertrophy but no evidence of obstruction, seeds, spacers or lesions. The bladder was then entered and fully and systematically inspected. The ureteral orifices were noted to be of normal configuration and position. The mucosa revealed no evidence of tumors. There were also no stones identified within the bladder. I noted no seeds or spacers on the floor of the bladder and retroflexion of the scope revealed no seeds protruding from the base of the prostate.  The cystoscope was then removed and a new 66 French Foley catheter was then inserted and the balloon was filled with 10 cc of sterile water. This was connected to closed system drainage and the patient was awakened and taken to recovery room in stable and satisfactory condition. He tolerated procedure well and there were no intraoperative complications.

## 2017-04-30 NOTE — Discharge Instructions (Signed)
Indwelling Urinary Catheter Care, Adult Take good care of your catheter to keep it working and to prevent problems. How to wear your catheter Attach your catheter to your leg with tape (adhesive tape) or a leg strap. Make sure it is not too tight. If you use tape, remove any bits of tape that are already on the catheter. How to wear a drainage bag You should have:  A large overnight bag.  A small leg bag.  Overnight Bag You may wear the overnight bag at any time. Always keep the bag below the level of your bladder but off the floor. When you sleep, put a clean plastic bag in a wastebasket. Then hang the bag inside the wastebasket. Leg Bag Never wear the leg bag at night. Always wear the leg bag below your knee. Keep the leg bag secure with a leg strap or tape. How to care for your skin  Clean the skin around the catheter at least once every day.  Shower every day. Do not take baths.  Put creams, lotions, or ointments on your genital area only as told by your doctor.  Do not use powders, sprays, or lotions on your genital area. How to clean your catheter and your skin 1. Wash your hands with soap and water. 2. Wet a washcloth in warm water and gentle (mild) soap. 3. Use the washcloth to clean the skin where the catheter enters your body. Clean downward and wipe away from the catheter in small circles. Do not wipe toward the catheter. 4. Pat the area dry with a clean towel. Make sure to clean off all soap. How to care for your drainage bags Empty your drainage bag when it is ?- full or at least 2-3 times a day. Replace your drainage bag once a month or sooner if it starts to smell bad or look dirty. Do not clean your drainage bag unless told by your doctor. Emptying a drainage bag  Supplies Needed  Rubbing alcohol.  Gauze pad or cotton ball.  Tape or a leg strap.  Steps 1. Wash your hands with soap and water. 2. Separate (detach) the bag from your leg. 3. Hold the bag over  the toilet or a clean container. Keep the bag below your hips and bladder. This stops pee (urine) from going back into the tube. 4. Open the pour spout at the bottom of the bag. 5. Empty the pee into the toilet or container. Do not let the pour spout touch any surface. 6. Put rubbing alcohol on a gauze pad or cotton ball. 7. Use the gauze pad or cotton ball to clean the pour spout. 8. Close the pour spout. 9. Attach the bag to your leg with tape or a leg strap. 10. Wash your hands.  Changing a drainage bag Supplies Needed  Alcohol wipes.  A clean drainage bag.  Adhesive tape or a leg strap.  Steps 1. Wash your hands with soap and water. 2. Separate the dirty bag from your leg. 3. Pinch the rubber catheter with your fingers so that pee does not spill out. 4. Separate the catheter tube from the drainage tube where these tubes connect (at the connection valve). Do not let the tubes touch any surface. 5. Clean the end of the catheter tube with an alcohol wipe. Use a different alcohol wipe to clean the end of the drainage tube. 6. Connect the catheter tube to the drainage tube of the clean bag. 7. Attach the new bag to  the leg with adhesive tape or a leg strap. 8. Wash your hands.  How to prevent infection and other problems  Never pull on your catheter or try to remove it. Pulling can damage tissue in your body.  Always wash your hands before and after touching your catheter.  If a leg strap gets wet, replace it with a dry one.  Drink enough fluids to keep your pee clear or pale yellow, or as told by your doctor.  Do not let the drainage bag or tubing touch the floor.  Wear cotton underwear.  If you are male, wipe from front to back after you poop (have a bowel movement).  Check on the catheter often to make sure it works and the tubing is not twisted. Get help if:  Your pee is cloudy.  Your pee smells unusually bad.  Your pee is not draining into the bag.  Your  tube gets clogged.  Your catheter starts to leak.  Your bladder feels full. Get help right away if:  You have redness, swelling, or pain where the catheter enters your body.  You have fluid, pus, or a bad smell coming from the area where the catheter enters your body.  The area where the catheter enters your body feels warm.  You have a fever.  You have pain in your: ? Stomach (abdomen). ? Legs. ? Lower back. ? Bladder.  You see blood fill the catheter.  Your pee is pink or red.  You feel sick to your stomach (nauseous).  You throw up (vomit).  You have chills.  Your catheter gets pulled out. This information is not intended to replace advice given to you by your health care provider. Make sure you discuss any questions you have with your health care provider. Document Released: 04/29/2012 Document Revised: 12/01/2015 Document Reviewed: 06/17/2013 Elsevier Interactive Patient Education  2018 [place]  Radioactive Seed Implant Home Care Instructions   Activity: Rest for the remainder of the day.  Do not drive or operate equipment today.  You may resume normal activities in a few days as instructed by your physician, without risk of harmful radiation exposure to those around you, provided you follow the time and distance precautions on the Radiation Oncology Instruction Sheet.   Meals: Drink plenty of lipuids and eat light foods, such as gelatin or soup this evening .  You may return to normal meal plan tomorrow.  Return To Work You may return to work as instructed by [employer].  Special Instruction:6  If any seeds are found, use tweezers to pick up seeds and place in a glass container of any kind and bring to your physician's office.  Call your physician if any of these symptoms occur:   Persistent or heavy bleeding  Urine stream diminishes or stops completely after catheter is removed  Fever equal to or greater than 101 degrees F  Cloudy urine  with a strong foul odor  Severe pain  You may feel some burning pain and/or hesitancy when you urinate after the catheter is removed.  These symptoms may increase over the next few weeks, but should diminish within forur to six weeks.  Applying moist heat to the lower abdomen or a hot tub bath may help relieve the pain.  If the discomfort becomes severe, please call your physician for additional medications.  No Advil, Ibuprofen, Aleve, or Motrin until 9: 30 PM.  Post Anesthesia Home Care Instructions  Activity: Get plenty of rest for the remainder  of the day. A responsible individual must stay with you for 24 hours following the procedure.  For the next 24 hours, DO NOT: -Drive a car -Paediatric nurse -Drink alcoholic beverages -Take any medication unless instructed by your physician -Make any legal decisions or sign important papers.  Meals: Start with liquid foods such as gelatin or soup. Progress to regular foods as tolerated. Avoid greasy, spicy, heavy foods. If nausea and/or vomiting occur, drink only clear liquids until the nausea and/or vomiting subsides. Call your physician if vomiting continues.  Special Instructions/Symptoms: Your throat may feel dry or sore from the anesthesia or the breathing tube placed in your throat during surgery. If this causes discomfort, gargle with warm salt water. The discomfort should disappear within 24 hours.  If you had a scopolamine patch placed behind your ear for the management of post- operative nausea and/or vomiting:  1. The medication in the patch is effective for 72 hours, after which it should be removed.  Wrap patch in a tissue and discard in the trash. Wash hands thoroughly with soap and water. 2. You may remove the patch earlier than 72 hours if you experience unpleasant side effects which may include dry mouth, dizziness or visual disturbances. 3. Avoid touching the patch. Wash your hands with soap and water after contact with the  patch.

## 2017-04-30 NOTE — Transfer of Care (Signed)
Immediate Anesthesia Transfer of Care Note  Patient: Jesse Goodwin  Procedure(s) Performed: Procedure(s) (LRB): RADIOACTIVE SEED IMPLANT/BRACHYTHERAPY IMPLANT (N/A) SPACE OAR INSTILLATION (N/A)  Patient Location: PACU  Anesthesia Type: General  Level of Consciousness: awake, sedated, patient cooperative and responds to stimulation  Airway & Oxygen Therapy: Patient Spontanous Breathing and Patient connected to NCO2  Post-op Assessment: Report given to PACU RN, Post -op Vital signs reviewed and stable and Patient moving all extremities  Post vital signs: Reviewed and stable  Complications: No apparent anesthesia complications

## 2017-05-01 ENCOUNTER — Encounter (HOSPITAL_BASED_OUTPATIENT_CLINIC_OR_DEPARTMENT_OTHER): Payer: Self-pay | Admitting: Urology

## 2017-05-02 LAB — POCT HEMOGLOBIN-HEMACUE: Hemoglobin: 15.4 g/dL (ref 13.0–17.0)

## 2017-05-04 NOTE — Progress Notes (Signed)
  Radiation Oncology         [phone] ________________________________  Name: Carley Strickling MRN: [id number]  Date: 05/04/2017  DOB: Sep 11, 1945       Prostate Seed Implant  ZY:[id number], [name], MD  No ref. provider found  DIAGNOSIS: 72 y.o. gentleman with Stage T1c adenocarcinoma of the prostate with Gleason Score of 4+3, and PSA of 8.55  PROCEDURE: Insertion of radioactive I-125 seeds into the prostate gland.  RADIATION DOSE: 145 Gy, definitive/boost therapy.  TECHNIQUE: Jondavid Schreier was brought to the operating room with the urologist. He was placed in the dorsolithotomy position. He was catheterized and a rectal tube was inserted. The perineum was shaved, prepped and draped. The ultrasound probe was then introduced into the rectum to see the prostate gland.  TREATMENT DEVICE: A needle grid was attached to the ultrasound probe stand and anchor needles were placed.  3D PLANNING: The prostate was imaged in 3D using a sagittal sweep of the prostate probe. These images were transferred to the planning computer. There, the prostate, urethra and rectum were defined on each axial reconstructed image. Then, the software created an optimized 3D plan and a few seed positions were adjusted. The quality of the plan was reviewed using [facility] information for the target and the following two organs at risk:  Urethra and Rectum.  Then the accepted plan was uploaded to the seed Selectron afterloading unit.  PROSTATE VOLUME STUDY:  Using transrectal ultrasound the volume of the prostate was verified to be 48 cc.  SPECIAL TREATMENT PROCEDURE/SUPERVISION AND HANDLING: The Nucletron FIRST system was used to place the needles under sagittal guidance. A total of 24 needles were used to deposit 77 seeds in the prostate gland. The individual seed activity was 0.479 mCi.  SpaceOAR:  Yes  COMPLEX SIMULATION: At the end of the procedure, an anterior radiograph of the pelvis was obtained to document seed positioning  and count. Cystoscopy was performed to check the urethra and bladder.  MICRODOSIMETRY: At the end of the procedure, the patient was emitting 0.26 mR/hr at 1 meter. Accordingly, he was considered safe for hospital discharge.  PLAN: The patient will return to the radiation oncology clinic for post implant CT dosimetry in three weeks.   ________________________________  Sheral Apley Tammi Klippel, M.D.

## 2017-05-09 ENCOUNTER — Telehealth: Payer: Self-pay | Admitting: *Deleted

## 2017-05-09 NOTE — Telephone Encounter (Signed)
CALLED PATIENT TO REMIND OF POST SEED APPTS. ON 05-10-17 AND HIS MRI ON 05-11-17 - ARRIVAL TIME - 7:30 AM @ [facility], NO RESTRICTIONS TO TEST, LVM FOR A RETURN CALL

## 2017-05-10 ENCOUNTER — Encounter: Payer: Self-pay | Admitting: Medical Oncology

## 2017-05-10 ENCOUNTER — Encounter: Payer: Self-pay | Admitting: Radiation Oncology

## 2017-05-10 ENCOUNTER — Ambulatory Visit
Admission: RE | Admit: 2017-05-10 | Discharge: 2017-05-10 | Disposition: A | Discharge status: Home / Self Care | Payer: Medicare Other | Source: Ambulatory Visit | Attending: Radiation Oncology | Admitting: Radiation Oncology

## 2017-05-10 ENCOUNTER — Other Ambulatory Visit: Payer: Self-pay

## 2017-05-10 DIAGNOSIS — C61 Malignant neoplasm of prostate: Secondary | ICD-10-CM | POA: Insufficient documentation

## 2017-05-10 DIAGNOSIS — Z510 Encounter for antineoplastic radiation therapy: Secondary | ICD-10-CM | POA: Diagnosis not present

## 2017-05-10 DIAGNOSIS — Z923 Personal history of irradiation: Secondary | ICD-10-CM | POA: Insufficient documentation

## 2017-05-10 DIAGNOSIS — Y842 Radiological procedure and radiotherapy as the cause of abnormal reaction of the patient, or of later complication, without mention of misadventure at the time of the procedure: Secondary | ICD-10-CM | POA: Insufficient documentation

## 2017-05-10 DIAGNOSIS — R350 Frequency of micturition: Secondary | ICD-10-CM | POA: Diagnosis not present

## 2017-05-10 DIAGNOSIS — R300 Dysuria: Secondary | ICD-10-CM | POA: Insufficient documentation

## 2017-05-10 NOTE — Progress Notes (Signed)
  Radiation Oncology         [phone] ________________________________  Name: Shadi Sessler MRN: [id number]  Date: 05/10/2017  DOB: July 08, 1945  COMPLEX SIMULATION NOTE  NARRATIVE:  The patient was brought to the [facility] today following prostate seed implantation approximately one month ago.  Identity was confirmed.  All relevant records and images related to the planned course of therapy were reviewed.  Then, the patient was set-up supine.  CT images were obtained.  The CT images were loaded into the planning software.  Then the prostate and rectum were contoured.  Treatment planning then occurred.  The implanted iodine 125 seeds were identified by the physics staff for projection of radiation distribution  I have requested : 3D Simulation  I have requested a DVH of the following structures: Prostate and rectum.    ________________________________  Sheral Apley Tammi Klippel, M.D.  This document serves as a record of services personally performed by Tyler Pita, MD. It was created on his behalf by Rae Lips, a trained medical scribe. The creation of this record is based on the scribe's personal observations and the provider's statements to them. This document has been checked and approved by the attending provider.

## 2017-05-10 NOTE — Progress Notes (Signed)
Radiation Oncology         [phone] ________________________________  Name: Jesse Goodwin MRN: [id number]  Date: 05/10/2017  DOB: 1945/11/23  Post-Seed Follow-Up Visit Note  CC: Cari Caraway, MD  Cari Caraway, MD  Diagnosis:   72 y.o. gentleman with Stage T1c adenocarcinoma of the prostate with Gleason Score of 4+3, and PSA of 8.55     ICD-10-CM   1. Malignant neoplasm of prostate ([place]) C61     Interval Since Last Radiation:  1.5 weeks - Insertion of radioactive I-125 seeds into the prostate gland, 145 Gy definitive/boost therapy, with SpaceOAR  Narrative:  The patient returns today for routine follow-up.  He is complaining of increased urinary frequency and hesitation symptoms and dysuria. He denies any hematuria, urinary leakage or incontinence. He filled out a questionnaire regarding urinary function today providing an overall IPSS score of 12 characterizing his symptoms as moderate but confirms his urinary symptoms are gradually improving.  His pre-implant score was 1. He denies any bowel symptoms. He is scheduled for MRI to confirm SpaceOAR placement tomorrow morning.  ALLERGIES:  has No Known Allergies.  Meds: Current Outpatient Medications  Medication Sig Dispense Refill  . amLODipine (NORVASC) 10 MG tablet Take 10 mg by mouth daily.  0  . aspirin 325 MG EC tablet Take 325 mg by mouth daily.    Marland Kitchen azelastine (ASTELIN) 0.1 % nasal spray   0  . losartan (COZAAR) 100 MG tablet   0  . simvastatin (ZOCOR) 20 MG tablet Take 20 mg by mouth at bedtime.    . Tetrahydrozoline HCl (VISINE OP) Place 2 drops into both eyes daily as needed (dry eye).    . traMADol (ULTRAM) 50 MG tablet Take 1 tablet (50 mg total) by mouth every 6 (six) hours as needed. 30 tablet 0  . Triamcinolone Acetonide (NASACORT ALLERGY 24HR NA) Place into the nose daily. 1 spray each nostril     No current facility-administered medications for this encounter.     Physical Findings: In general this is a  well appearing caucasian male in no acute distress. He's alert and oriented x4 and appropriate throughout the examination. Cardiopulmonary assessment is negative for acute distress and he exhibits normal effort.   Lab Findings: Lab Results  Component Value Date   WBC 5.6 04/23/2017   HGB 15.4 04/30/2017   HCT 45.1 04/23/2017   MCV 93.8 04/23/2017   PLT 196 04/23/2017    Radiographic Findings:  Patient underwent CT imaging in our clinic for post implant dosimetry. The CT was reviewed by Dr. Tammi Klippel and appears to demonstrate an adequate distribution of radioactive seeds throughout the prostate gland. There are no seeds in or near the rectum. We suspect the final radiation plan and dosimetry will show appropriate coverage of the prostate gland.   Impression/Plan: The patient is recovering from the effects of radiation. His urinary symptoms should gradually improve over the next 4-6 months. We talked about this today. He is encouraged by his improvement already and is otherwise pleased with his outcome. We also talked about long-term follow-up for prostate cancer following seed implant. He understands that ongoing PSA determinations and digital rectal exams will help perform surveillance to rule out disease recurrence. He has a follow up appointment scheduled with his urologist in 3 months. He understands what to expect with his PSA measures. Patient was also educated today about some of the long-term effects from radiation including a small risk for rectal bleeding and possibly erectile dysfunction. We  talked about some of the general management approaches to these potential complications. However, I did encourage the patient to contact our office or return at any point if he has questions or concerns related to his previous radiation and prostate cancer.  ------------------------------------------------   Tyler Pita, MD [place] Director and Director of  Stereotactic Radiosurgery Direct Dial: [phone]  Fax: [phone] [email or web address]  Skype  [email or web address]  This document serves as a record of services personally performed by Tyler Pita, MD. It was created on his behalf by Rae Lips, a trained medical scribe. The creation of this record is based on the scribe's personal observations and the provider's statements to them. This document has been checked and approved by the attending provider.

## 2017-05-10 NOTE — Progress Notes (Signed)
Patient returns for post seed follow up with Dr. Tammi Klippel Pre seed IPSS 1. Post seed IPSS 12. Patient scheduled for MRI to confirm SpaceOar placement tomorrow morning. Reports dysuria. Denies hematuria, urinary leakage or incontinence. Patient confirms his urinary symptoms are gradually improving. Patient planning to follow up with his urologist in 3 months.  BP 126/89   Pulse 77   Temp 98.2 F (36.8 C) (Oral)   Resp 20   Wt 196 lb (88.9 kg)   SpO2 95%   BMI 28.94 kg/m  Wt Readings from Last 3 Encounters:  05/10/17 196 lb (88.9 kg)  04/30/17 195 lb 12.8 oz (88.8 kg)  02/01/17 199 lb 12.8 oz (90.6 kg)

## 2017-05-11 ENCOUNTER — Ambulatory Visit (HOSPITAL_COMMUNITY)
Admission: RE | Admit: 2017-05-11 | Discharge: 2017-05-11 | Disposition: A | Discharge status: Home / Self Care | Payer: Medicare Other | Source: Ambulatory Visit | Attending: Urology | Admitting: Urology

## 2017-05-11 DIAGNOSIS — R05 Cough: Secondary | ICD-10-CM | POA: Diagnosis not present

## 2017-05-11 DIAGNOSIS — C61 Malignant neoplasm of prostate: Secondary | ICD-10-CM | POA: Diagnosis not present

## 2017-05-11 DIAGNOSIS — J301 Allergic rhinitis due to pollen: Secondary | ICD-10-CM | POA: Diagnosis not present

## 2017-05-11 DIAGNOSIS — J3089 Other allergic rhinitis: Secondary | ICD-10-CM | POA: Diagnosis not present

## 2017-06-07 ENCOUNTER — Ambulatory Visit: Payer: Medicare Other | Attending: Radiation Oncology | Admitting: Radiation Oncology

## 2017-06-07 ENCOUNTER — Encounter: Payer: Self-pay | Admitting: Radiation Oncology

## 2017-06-07 DIAGNOSIS — C61 Malignant neoplasm of prostate: Secondary | ICD-10-CM | POA: Insufficient documentation

## 2017-06-07 DIAGNOSIS — Z510 Encounter for antineoplastic radiation therapy: Secondary | ICD-10-CM | POA: Diagnosis not present

## 2017-06-08 DIAGNOSIS — R7301 Impaired fasting glucose: Secondary | ICD-10-CM | POA: Diagnosis not present

## 2017-06-08 DIAGNOSIS — J309 Allergic rhinitis, unspecified: Secondary | ICD-10-CM | POA: Diagnosis not present

## 2017-06-08 DIAGNOSIS — C61 Malignant neoplasm of prostate: Secondary | ICD-10-CM | POA: Diagnosis not present

## 2017-06-08 DIAGNOSIS — E7800 Pure hypercholesterolemia, unspecified: Secondary | ICD-10-CM | POA: Diagnosis not present

## 2017-06-08 DIAGNOSIS — I10 Essential (primary) hypertension: Secondary | ICD-10-CM | POA: Diagnosis not present

## 2017-06-11 NOTE — Progress Notes (Signed)
  Radiation Oncology         [phone] ________________________________  Name: Jesse Goodwin MRN: [id number]  Date: 06/07/2017  DOB: 01/28/1945  3D Planning Note   Prostate Brachytherapy Post-Implant Dosimetry  Diagnosis: 72 y.o. gentleman with Stage T1c adenocarcinoma of the prostate with Gleason Score of 4+3, and PSA of 8.55  Narrative: On a previous date, Maliek Schellhorn returned following prostate seed implantation for post implant planning. He underwent CT scan complex simulation to delineate the three-dimensional structures of the pelvis and demonstrate the radiation distribution.  Since that time, the seed localization, and complex isodose planning with dose volume histograms have now been completed.  Results:   Prostate Coverage - The dose of radiation delivered to the 90% or more of the prostate gland (D90) was 83.25% of the prescription dose. This does not meet our goal of greater than 90%, however on detailed review of the post-plan, the coverage is excellent bilaterally and posteriorly.  The area which is not well covered is the anterior central gland in the peri-urethral and area.  Rectal Sparing - The volume of rectal tissue receiving the prescription dose or higher was 0.0 cc. This falls under our thresholds tolerance of 1.0 cc.  Impression: The prostate seed implant appears to show adequate target coverage in clinically important areas and appropriate rectal sparing.  Plan:  The patient will continue to follow with urology for ongoing PSA determinations. I would anticipate a high likelihood for local tumor control with minimal risk for rectal morbidity.  ________________________________  Sheral Apley Tammi Klippel, M.D.

## 2017-07-27 DIAGNOSIS — C61 Malignant neoplasm of prostate: Secondary | ICD-10-CM | POA: Diagnosis not present

## 2017-08-16 DIAGNOSIS — N401 Enlarged prostate with lower urinary tract symptoms: Secondary | ICD-10-CM | POA: Diagnosis not present

## 2017-08-16 DIAGNOSIS — R351 Nocturia: Secondary | ICD-10-CM | POA: Diagnosis not present

## 2017-08-16 DIAGNOSIS — C61 Malignant neoplasm of prostate: Secondary | ICD-10-CM | POA: Diagnosis not present

## 2017-08-26 DIAGNOSIS — I10 Essential (primary) hypertension: Secondary | ICD-10-CM | POA: Diagnosis not present

## 2017-08-26 DIAGNOSIS — R197 Diarrhea, unspecified: Secondary | ICD-10-CM | POA: Diagnosis not present

## 2017-08-27 DIAGNOSIS — R197 Diarrhea, unspecified: Secondary | ICD-10-CM | POA: Diagnosis not present

## 2017-09-10 DIAGNOSIS — R197 Diarrhea, unspecified: Secondary | ICD-10-CM | POA: Diagnosis not present

## 2017-09-11 DIAGNOSIS — R197 Diarrhea, unspecified: Secondary | ICD-10-CM | POA: Diagnosis not present

## 2017-10-01 DIAGNOSIS — K529 Noninfective gastroenteritis and colitis, unspecified: Secondary | ICD-10-CM | POA: Diagnosis not present

## 2017-10-08 DIAGNOSIS — H2513 Age-related nuclear cataract, bilateral: Secondary | ICD-10-CM | POA: Diagnosis not present

## 2017-10-08 DIAGNOSIS — H52203 Unspecified astigmatism, bilateral: Secondary | ICD-10-CM | POA: Diagnosis not present

## 2017-10-26 DIAGNOSIS — K529 Noninfective gastroenteritis and colitis, unspecified: Secondary | ICD-10-CM | POA: Diagnosis not present

## 2017-10-26 DIAGNOSIS — Z23 Encounter for immunization: Secondary | ICD-10-CM | POA: Diagnosis not present

## 2017-11-07 DIAGNOSIS — H25811 Combined forms of age-related cataract, right eye: Secondary | ICD-10-CM | POA: Diagnosis not present

## 2017-11-07 DIAGNOSIS — H2511 Age-related nuclear cataract, right eye: Secondary | ICD-10-CM | POA: Diagnosis not present

## 2017-11-19 DIAGNOSIS — C61 Malignant neoplasm of prostate: Secondary | ICD-10-CM | POA: Diagnosis not present

## 2017-11-26 DIAGNOSIS — C61 Malignant neoplasm of prostate: Secondary | ICD-10-CM | POA: Diagnosis not present

## 2018-02-12 DIAGNOSIS — Z0000 Encounter for general adult medical examination without abnormal findings: Secondary | ICD-10-CM | POA: Diagnosis not present

## 2018-02-12 DIAGNOSIS — E7800 Pure hypercholesterolemia, unspecified: Secondary | ICD-10-CM | POA: Diagnosis not present

## 2018-02-12 DIAGNOSIS — R300 Dysuria: Secondary | ICD-10-CM | POA: Diagnosis not present

## 2018-02-12 DIAGNOSIS — N529 Male erectile dysfunction, unspecified: Secondary | ICD-10-CM | POA: Diagnosis not present

## 2018-02-12 DIAGNOSIS — Z8546 Personal history of malignant neoplasm of prostate: Secondary | ICD-10-CM | POA: Diagnosis not present

## 2018-02-12 DIAGNOSIS — I10 Essential (primary) hypertension: Secondary | ICD-10-CM | POA: Diagnosis not present

## 2018-02-12 DIAGNOSIS — J309 Allergic rhinitis, unspecified: Secondary | ICD-10-CM | POA: Diagnosis not present

## 2018-02-12 DIAGNOSIS — R7301 Impaired fasting glucose: Secondary | ICD-10-CM | POA: Diagnosis not present

## 2018-02-12 DIAGNOSIS — Z1389 Encounter for screening for other disorder: Secondary | ICD-10-CM | POA: Diagnosis not present

## 2018-02-18 DIAGNOSIS — C61 Malignant neoplasm of prostate: Secondary | ICD-10-CM | POA: Diagnosis not present

## 2018-02-25 DIAGNOSIS — C61 Malignant neoplasm of prostate: Secondary | ICD-10-CM | POA: Diagnosis not present

## 2018-03-10 DIAGNOSIS — R040 Epistaxis: Secondary | ICD-10-CM | POA: Diagnosis not present

## 2018-03-14 DIAGNOSIS — R040 Epistaxis: Secondary | ICD-10-CM | POA: Diagnosis not present

## 2018-03-14 DIAGNOSIS — R05 Cough: Secondary | ICD-10-CM | POA: Diagnosis not present

## 2018-03-14 DIAGNOSIS — J301 Allergic rhinitis due to pollen: Secondary | ICD-10-CM | POA: Diagnosis not present

## 2018-03-14 DIAGNOSIS — J3089 Other allergic rhinitis: Secondary | ICD-10-CM | POA: Diagnosis not present

## 2018-06-21 DIAGNOSIS — H2512 Age-related nuclear cataract, left eye: Secondary | ICD-10-CM | POA: Diagnosis not present

## 2018-06-21 DIAGNOSIS — H524 Presbyopia: Secondary | ICD-10-CM | POA: Diagnosis not present

## 2018-07-03 DIAGNOSIS — H25812 Combined forms of age-related cataract, left eye: Secondary | ICD-10-CM | POA: Diagnosis not present

## 2018-07-03 DIAGNOSIS — H2512 Age-related nuclear cataract, left eye: Secondary | ICD-10-CM | POA: Diagnosis not present

## 2018-08-05 DIAGNOSIS — J301 Allergic rhinitis due to pollen: Secondary | ICD-10-CM | POA: Diagnosis not present

## 2018-08-05 DIAGNOSIS — R05 Cough: Secondary | ICD-10-CM | POA: Diagnosis not present

## 2018-08-05 DIAGNOSIS — J3089 Other allergic rhinitis: Secondary | ICD-10-CM | POA: Diagnosis not present

## 2018-08-05 DIAGNOSIS — R040 Epistaxis: Secondary | ICD-10-CM | POA: Diagnosis not present

## 2018-08-27 DIAGNOSIS — R7301 Impaired fasting glucose: Secondary | ICD-10-CM | POA: Diagnosis not present

## 2018-08-27 DIAGNOSIS — I10 Essential (primary) hypertension: Secondary | ICD-10-CM | POA: Diagnosis not present

## 2018-08-27 DIAGNOSIS — E7800 Pure hypercholesterolemia, unspecified: Secondary | ICD-10-CM | POA: Diagnosis not present

## 2018-09-05 DIAGNOSIS — N529 Male erectile dysfunction, unspecified: Secondary | ICD-10-CM | POA: Diagnosis not present

## 2018-09-05 DIAGNOSIS — I10 Essential (primary) hypertension: Secondary | ICD-10-CM | POA: Diagnosis not present

## 2018-09-05 DIAGNOSIS — E7800 Pure hypercholesterolemia, unspecified: Secondary | ICD-10-CM | POA: Diagnosis not present

## 2018-09-05 DIAGNOSIS — R7301 Impaired fasting glucose: Secondary | ICD-10-CM | POA: Diagnosis not present

## 2018-09-05 DIAGNOSIS — Z8673 Personal history of transient ischemic attack (TIA), and cerebral infarction without residual deficits: Secondary | ICD-10-CM | POA: Diagnosis not present

## 2018-09-05 DIAGNOSIS — J309 Allergic rhinitis, unspecified: Secondary | ICD-10-CM | POA: Diagnosis not present

## 2018-09-17 DIAGNOSIS — H43822 Vitreomacular adhesion, left eye: Secondary | ICD-10-CM | POA: Diagnosis not present

## 2018-10-04 DIAGNOSIS — Z23 Encounter for immunization: Secondary | ICD-10-CM | POA: Diagnosis not present

## 2018-10-15 DIAGNOSIS — C61 Malignant neoplasm of prostate: Secondary | ICD-10-CM | POA: Diagnosis not present

## 2018-10-15 DIAGNOSIS — R3912 Poor urinary stream: Secondary | ICD-10-CM | POA: Diagnosis not present

## 2018-10-15 DIAGNOSIS — N401 Enlarged prostate with lower urinary tract symptoms: Secondary | ICD-10-CM | POA: Diagnosis not present

## 2018-11-05 DIAGNOSIS — H43822 Vitreomacular adhesion, left eye: Secondary | ICD-10-CM | POA: Diagnosis not present

## 2018-11-06 DIAGNOSIS — E7800 Pure hypercholesterolemia, unspecified: Secondary | ICD-10-CM | POA: Diagnosis not present

## 2018-12-18 ENCOUNTER — Other Ambulatory Visit: Payer: Self-pay

## 2018-12-18 DIAGNOSIS — Z20828 Contact with and (suspected) exposure to other viral communicable diseases: Secondary | ICD-10-CM | POA: Diagnosis not present

## 2018-12-18 DIAGNOSIS — Z20822 Contact with and (suspected) exposure to covid-19: Secondary | ICD-10-CM

## 2018-12-20 LAB — NOVEL CORONAVIRUS, NAA: SARS-CoV-2, NAA: NOT DETECTED

## 2019-03-25 DIAGNOSIS — Z0000 Encounter for general adult medical examination without abnormal findings: Secondary | ICD-10-CM | POA: Diagnosis not present

## 2019-03-25 DIAGNOSIS — Z8673 Personal history of transient ischemic attack (TIA), and cerebral infarction without residual deficits: Secondary | ICD-10-CM | POA: Diagnosis not present

## 2019-03-25 DIAGNOSIS — N529 Male erectile dysfunction, unspecified: Secondary | ICD-10-CM | POA: Diagnosis not present

## 2019-03-25 DIAGNOSIS — R7309 Other abnormal glucose: Secondary | ICD-10-CM | POA: Diagnosis not present

## 2019-03-25 DIAGNOSIS — E7800 Pure hypercholesterolemia, unspecified: Secondary | ICD-10-CM | POA: Diagnosis not present

## 2019-03-25 DIAGNOSIS — I10 Essential (primary) hypertension: Secondary | ICD-10-CM | POA: Diagnosis not present

## 2019-03-25 DIAGNOSIS — I6523 Occlusion and stenosis of bilateral carotid arteries: Secondary | ICD-10-CM | POA: Diagnosis not present

## 2019-03-25 DIAGNOSIS — C61 Malignant neoplasm of prostate: Secondary | ICD-10-CM | POA: Diagnosis not present

## 2019-03-25 DIAGNOSIS — Z1389 Encounter for screening for other disorder: Secondary | ICD-10-CM | POA: Diagnosis not present

## 2019-03-25 DIAGNOSIS — N400 Enlarged prostate without lower urinary tract symptoms: Secondary | ICD-10-CM | POA: Diagnosis not present

## 2019-03-25 DIAGNOSIS — E663 Overweight: Secondary | ICD-10-CM | POA: Diagnosis not present

## 2019-03-28 DIAGNOSIS — K1329 Other disturbances of oral epithelium, including tongue: Secondary | ICD-10-CM | POA: Diagnosis not present

## 2019-04-02 DIAGNOSIS — R7309 Other abnormal glucose: Secondary | ICD-10-CM | POA: Diagnosis not present

## 2019-04-02 DIAGNOSIS — I6523 Occlusion and stenosis of bilateral carotid arteries: Secondary | ICD-10-CM | POA: Diagnosis not present

## 2019-04-02 DIAGNOSIS — N400 Enlarged prostate without lower urinary tract symptoms: Secondary | ICD-10-CM | POA: Diagnosis not present

## 2019-04-02 DIAGNOSIS — N529 Male erectile dysfunction, unspecified: Secondary | ICD-10-CM | POA: Diagnosis not present

## 2019-04-02 DIAGNOSIS — J309 Allergic rhinitis, unspecified: Secondary | ICD-10-CM | POA: Diagnosis not present

## 2019-04-02 DIAGNOSIS — Z0000 Encounter for general adult medical examination without abnormal findings: Secondary | ICD-10-CM | POA: Diagnosis not present

## 2019-04-02 DIAGNOSIS — E7800 Pure hypercholesterolemia, unspecified: Secondary | ICD-10-CM | POA: Diagnosis not present

## 2019-04-02 DIAGNOSIS — C61 Malignant neoplasm of prostate: Secondary | ICD-10-CM | POA: Diagnosis not present

## 2019-04-02 DIAGNOSIS — I10 Essential (primary) hypertension: Secondary | ICD-10-CM | POA: Diagnosis not present

## 2019-08-19 DIAGNOSIS — R05 Cough: Secondary | ICD-10-CM | POA: Diagnosis not present

## 2019-08-19 DIAGNOSIS — J301 Allergic rhinitis due to pollen: Secondary | ICD-10-CM | POA: Diagnosis not present

## 2019-08-19 DIAGNOSIS — J3089 Other allergic rhinitis: Secondary | ICD-10-CM | POA: Diagnosis not present

## 2019-08-19 DIAGNOSIS — R040 Epistaxis: Secondary | ICD-10-CM | POA: Diagnosis not present

## 2019-09-19 DIAGNOSIS — C61 Malignant neoplasm of prostate: Secondary | ICD-10-CM | POA: Diagnosis not present

## 2019-09-19 DIAGNOSIS — R3911 Hesitancy of micturition: Secondary | ICD-10-CM | POA: Diagnosis not present

## 2019-09-19 DIAGNOSIS — N401 Enlarged prostate with lower urinary tract symptoms: Secondary | ICD-10-CM | POA: Diagnosis not present

## 2019-11-03 DIAGNOSIS — Z23 Encounter for immunization: Secondary | ICD-10-CM | POA: Diagnosis not present

## 2019-11-18 DIAGNOSIS — Z23 Encounter for immunization: Secondary | ICD-10-CM | POA: Diagnosis not present

## 2019-11-28 DIAGNOSIS — H43822 Vitreomacular adhesion, left eye: Secondary | ICD-10-CM | POA: Diagnosis not present

## 2019-11-28 DIAGNOSIS — H524 Presbyopia: Secondary | ICD-10-CM | POA: Diagnosis not present

## 2019-12-01 DIAGNOSIS — E7800 Pure hypercholesterolemia, unspecified: Secondary | ICD-10-CM | POA: Diagnosis not present

## 2019-12-01 DIAGNOSIS — I6523 Occlusion and stenosis of bilateral carotid arteries: Secondary | ICD-10-CM | POA: Diagnosis not present

## 2019-12-01 DIAGNOSIS — R7309 Other abnormal glucose: Secondary | ICD-10-CM | POA: Diagnosis not present

## 2019-12-01 DIAGNOSIS — Z8673 Personal history of transient ischemic attack (TIA), and cerebral infarction without residual deficits: Secondary | ICD-10-CM | POA: Diagnosis not present

## 2019-12-01 DIAGNOSIS — C61 Malignant neoplasm of prostate: Secondary | ICD-10-CM | POA: Diagnosis not present

## 2019-12-01 DIAGNOSIS — N529 Male erectile dysfunction, unspecified: Secondary | ICD-10-CM | POA: Diagnosis not present

## 2019-12-01 DIAGNOSIS — N400 Enlarged prostate without lower urinary tract symptoms: Secondary | ICD-10-CM | POA: Diagnosis not present

## 2019-12-01 DIAGNOSIS — I10 Essential (primary) hypertension: Secondary | ICD-10-CM | POA: Diagnosis not present

## 2019-12-01 DIAGNOSIS — J309 Allergic rhinitis, unspecified: Secondary | ICD-10-CM | POA: Diagnosis not present

## 2019-12-25 NOTE — Progress Notes (Shared)
[facility] Note  12/29/2019     CHIEF COMPLAINT Patient presents for No chief complaint on file.   HISTORY OF PRESENT ILLNESS: Jesse Goodwin is a 74 y.o. male who presents to the clinic today for:     Referring physician: Cari Caraway, MD [place],  [place] [id number]  HISTORICAL INFORMATION:   Selected notes from the MEDICAL RECORD NUMBER Referred by Dr. 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 Tetrahydrozoline HCl (VISINE OP) Place 2 drops into both eyes daily as needed (dry eye).   No current facility-administered medications for this visit. (Ophthalmic Drugs)   Current Outpatient Medications (Other)  Medication Sig  . amLODipine (NORVASC) 10 MG tablet Take 10 mg by mouth daily.  Marland Kitchen aspirin 325 MG EC tablet Take 325 mg by mouth daily.  Marland Kitchen azelastine (ASTELIN) 0.1 % nasal spray   . losartan (COZAAR) 100 MG tablet   . simvastatin (ZOCOR) 20 MG tablet Take 20 mg by mouth at bedtime.  . Triamcinolone Acetonide (NASACORT ALLERGY 24HR NA) Place into the nose daily. 1 spray each nostril   No current facility-administered medications for this visit. (Other)      REVIEW OF SYSTEMS:    ALLERGIES No Known Allergies  PAST MEDICAL HISTORY Past Medical History:  Diagnosis Date  . Hypercholesteremia   . Hypertension   . Pre-diabetes    pre-dm   . Prostate cancer ([place])   . 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 CAROTID ENDARTERECTOMY     2014 FOR TREATMENT OF STROKE   . CHOLECYSTECTOMY     living in [place] at this time, does not remember when or who did it   . PROSTATE BIOPSY  2019  . RADIOACTIVE SEED IMPLANT N/A 04/30/2017   Procedure: RADIOACTIVE SEED IMPLANT/BRACHYTHERAPY IMPLANT;  Surgeon: Cleon Gustin, MD;   Location: [facility];  Service: Urology;  Laterality: N/A;  . SPACE OAR INSTILLATION N/A 04/30/2017   Procedure: SPACE OAR INSTILLATION;  Surgeon: Cleon Gustin, MD;  [facility]  Service: Urology;  Laterality: N/A;    FAMILY HISTORY Family History  Problem Relation Age of Onset  . Stroke Mother   . Breast cancer Mother   . Dementia Mother   . CAD Mother   . Diabetes Mellitus II Mother   . Cancer Mother        breast  . Diabetes Mother   . Heart disease Mother   . Hyperlipidemia Mother   . Hypertension Mother   . Hyperlipidemia Brother   . Hyperlipidemia Son   . Stroke Sister     SOCIAL HISTORY Social History   Tobacco Use  . Smoking status: Never Smoker  . Smokeless tobacco: Never Used  Vaping Use  . Vaping Use: Never used  Substance Use Topics  . Alcohol use: Yes    Alcohol/week: 14.0 standard drinks    Types: 7 Glasses of wine, 7 Shots of liquor per week    Comment: drinks mixed drinks and wine in the night.  . Drug use: No         OPHTHALMIC EXAM: Not recorded     IMAGING AND PROCEDURES  Imaging and Procedures for 12/29/2019  ASSESSMENT/PLAN:    ICD-10-CM   1. Retinal edema  H35.81     1.  2.  3.  Ophthalmic Meds Ordered this visit:  No orders of the defined types were placed in this encounter.      No follow-ups on file.  There are no Patient Instructions on file for this visit.   Explained the diagnoses, plan, and follow up with the patient and they expressed understanding.  Patient expressed understanding of the importance of proper follow up care.   This document serves as a record of services personally performed by Gardiner Sleeper, MD, PhD. It was created on their behalf by Leeann Must, [place], an ophthalmic technician. The creation of this record is the provider's dictation and/or activities during the visit.    Electronically signed by: Leeann Must, COA @TODAY @  7:57 AM   Gardiner Sleeper, M.D., Ph.D. Diseases & Surgery of the Retina and Vitreous [facility] @TODAY @     Abbreviations: M myopia (nearsighted); A astigmatism; H hyperopia (farsighted); P presbyopia; Mrx spectacle prescription;  CTL contact lenses; OD right eye; OS left eye; OU both eyes  XT exotropia; ET esotropia; PEK punctate epithelial keratitis; PEE punctate epithelial erosions; DES dry eye syndrome; MGD meibomian gland dysfunction; ATs artificial tears; PFAT's preservative free artificial tears; [place] nuclear sclerotic cataract; PSC posterior subcapsular cataract; ERM epi-retinal membrane; PVD posterior vitreous detachment; RD retinal detachment; DM diabetes mellitus; DR diabetic retinopathy; NPDR non-proliferative diabetic retinopathy; PDR proliferative diabetic retinopathy; CSME clinically significant macular edema; DME diabetic macular edema; dbh dot blot hemorrhages; CWS cotton wool spot; POAG primary open angle glaucoma; C/D cup-to-disc ratio; HVF humphrey visual field; GVF goldmann visual field; OCT optical coherence tomography; IOP intraocular pressure; BRVO Branch retinal vein occlusion; CRVO central retinal vein occlusion; CRAO central retinal artery occlusion; BRAO branch retinal artery occlusion; RT retinal tear; SB scleral buckle; PPV pars plana vitrectomy; VH Vitreous hemorrhage; PRP panretinal laser photocoagulation; IVK intravitreal kenalog; VMT vitreomacular traction; MH Macular hole;  NVD neovascularization of the disc; NVE neovascularization elsewhere; AREDS age related eye disease study; ARMD age related macular degeneration; POAG primary open angle glaucoma; EBMD epithelial/anterior basement membrane dystrophy; ACIOL anterior chamber intraocular lens; IOL intraocular lens; PCIOL posterior chamber intraocular lens; Phaco/IOL phacoemulsification with intraocular lens placement; [place] photorefractive keratectomy; LASIK laser assisted in situ keratomileusis;  HTN hypertension; DM diabetes mellitus; COPD chronic obstructive pulmonary disease

## 2019-12-29 ENCOUNTER — Encounter (INDEPENDENT_AMBULATORY_CARE_PROVIDER_SITE_OTHER): Payer: Medicare Other | Admitting: Ophthalmology

## 2019-12-29 ENCOUNTER — Encounter (INDEPENDENT_AMBULATORY_CARE_PROVIDER_SITE_OTHER): Payer: Self-pay

## 2019-12-29 DIAGNOSIS — H3581 Retinal edema: Secondary | ICD-10-CM

## 2020-02-13 ENCOUNTER — Ambulatory Visit (HOSPITAL_BASED_OUTPATIENT_CLINIC_OR_DEPARTMENT_OTHER): Payer: Medicare Other | Admitting: Registered"

## 2020-02-13 DIAGNOSIS — R7303 Prediabetes: Secondary | ICD-10-CM | POA: Diagnosis not present

## 2020-02-16 ENCOUNTER — Encounter: Payer: Self-pay | Admitting: Registered"

## 2020-02-16 NOTE — Progress Notes (Signed)
On 02/13/20 patient completed Core Session 1 of Diabetes Prevention Program course virtually with [facility]. The following learning objectives were met by the patient during this class:   Virtual Visit via Video Note  I connected with Nikholas Whitefield by a video enabled application and verified that I am speaking with the correct person.  Location: Patient: Home.  Provider: Office    Learning Objectives:   Be able to explain the purpose and benefits of the National Diabetes Prevention Program.   Be able to describe the events that will take place at every session.   Know the weight loss and physical activity goals established by the National Diabetes Prevention Program.   Know their own individual weight loss and physical activity goals.   Be able to explain the important effect of self-monitoring on behavior change.   Goals:  . Record food and beverage intake in "Food and Activity Tracker" over the next week.  . E-mail completed "Food and Activity Tracker" to Lifestyle Coach next week before session 2. . Circle the foods or beverages you think are highest in fat and calories in your food tracker. . Read the labels on the food you buy, and consider using measuring cups and spoons to help you calculate the amount you eat. We will talk about measuring in more detail in the coming weeks.   Follow-Up Plan:  Attend Core Session 2 next week.   E-mail completed "Food and Activity Tracker" to Lifestyle Coach next week before class.  

## 2020-02-20 ENCOUNTER — Encounter: Payer: Medicare Other | Attending: Family Medicine | Admitting: Registered"

## 2020-02-20 ENCOUNTER — Encounter: Payer: Self-pay | Admitting: Registered"

## 2020-02-20 DIAGNOSIS — R7303 Prediabetes: Secondary | ICD-10-CM | POA: Insufficient documentation

## 2020-02-20 NOTE — Progress Notes (Signed)
On 02/20/20 patient completed Core Session 2 of Diabetes Prevention Program course virtually with [facility]. The following learning objectives were met by the patient during this class:   I connected with Jontavius Rabalais by a video enabled application and verified that I am speaking with the correct person.  Location: Patient: Home.  Provider: Office.   Learning Objectives:  Self-monitor their weight during the weeks following Session 2.   Describe the relationship between fat and calories.   Explain the reason for, and basic principles of, self-monitoring fat grams and calories.   Identify their personal fat gram goals.   Use the ?Fat and Calorie Counter to calculate the calories and fat grams of a given selection of foods.   Keep a running total of the fat grams they eat each day.   Calculate fat, calories, and serving sizes from nutrition labels.   Goals:   Weigh yourself at the same time each day, or every few days, and record your weight in your Food and Activity Tracker.  Write down everything you eat and drink in your Food and Activity Tracker.  Measure portions as much as you can, and start reading labels.   Use the ?Fat and Calorie Counter to figure out the amount of fat and calories in what you ate, and write the amount down in your Food and Activity Tracker.  Keep a running fat gram total throughout the day. Come as close to your fat gram goal as you can.   Follow-Up Plan:  Attend Core Session 3 next week.   Email completed  "Food and Activity Tracker" to Lifestyle Coach next week.

## 2020-02-27 ENCOUNTER — Encounter (HOSPITAL_BASED_OUTPATIENT_CLINIC_OR_DEPARTMENT_OTHER): Payer: Medicare Other | Admitting: Registered"

## 2020-02-27 ENCOUNTER — Encounter: Payer: Self-pay | Admitting: Registered"

## 2020-02-27 DIAGNOSIS — R7303 Prediabetes: Secondary | ICD-10-CM

## 2020-02-27 NOTE — Progress Notes (Signed)
On 02/27/20 patient completed Core Session 3 of Diabetes Prevention Program course virtually with [facility]. The following learning objectives were met by the patient during this class:   I connected with Canuto Kingston by a video enabled application and verified that I am speaking with the correct person.  Location: Patient: Home.  Provider: Office.   Learning Objectives:  Weigh and measure foods.  Estimate the fat and calorie content of common foods.  Describe three ways to eat less fat and fewer calories.  Create a plan to eat less fat for the following week.   Goals:   Track weight when weighing outside of class.   Track food and beverages eaten each day in Food and Activity Tracker and include fat grams and calories for each.   Try to stay within fat gram goal.   Complete plan for eating less high fat foods and answer related homework questions.    Follow-Up Plan:  Attend Core Session 4 next week.   Bring completed "Food and Activity Tracker" next week to be reviewed by Lifestyle Coach.

## 2020-03-05 ENCOUNTER — Encounter (HOSPITAL_BASED_OUTPATIENT_CLINIC_OR_DEPARTMENT_OTHER): Payer: Medicare Other | Admitting: Registered"

## 2020-03-05 DIAGNOSIS — R7303 Prediabetes: Secondary | ICD-10-CM | POA: Diagnosis not present

## 2020-03-11 ENCOUNTER — Encounter: Payer: Self-pay | Admitting: Registered"

## 2020-03-11 NOTE — Progress Notes (Addendum)
On 03/05/20 patient completed Core Session 4 of Diabetes Prevention Program course virtually with [facility]. The following learning objectives were met by the patient during this class:   Virtual Visit via Video Note  I connected with Maysin Carstens on 03/05/20 at 11:30 AM EST by a video enabled application and verified that I am speaking with the correct person using two identifiers.  Location: Patient: Home. Provider: Office.   Learning Objectives:  Describe the MyPlate food guide and its recommendations, including how to reduce fat and calories in our diet.  Compare and contrast MyPlate guidelines with participants' eating habits.  List ways to replace high-fat and high-calorie foods with low-fat and low-calorie foods.  Explain the importance of eating plenty of whole grains, vegetables, and fruits, while staying within fat gram goals.  Explain the importance of eating foods from all groups of MyPlate and of eating a variety of foods from within each group.  Explain why a balanced diet is beneficial to health.  Goals:   Record weight taken outside of class.   Track foods and beverages eaten each day in the "Food and Activity Tracker," including calories and fat grams for each item.   Practice comparing what you eat with the recommendations of MyPlate using the "Rate Your Plate" handout.   Complete the "Rate Your Plate" handout form on at least 3 days.   Answer homework questions.   Follow-Up Plan:  Attend Core Session 5 next week.   Email completed "Food and Activity Tracker" next week to be reviewed by Lifestyle Coach.

## 2020-03-12 ENCOUNTER — Encounter: Payer: Medicare Other | Admitting: Registered"

## 2020-03-19 ENCOUNTER — Encounter: Payer: Medicare Other | Attending: Family Medicine | Admitting: Registered"

## 2020-03-19 DIAGNOSIS — R7303 Prediabetes: Secondary | ICD-10-CM | POA: Insufficient documentation

## 2020-03-21 ENCOUNTER — Encounter: Payer: Self-pay | Admitting: Registered"

## 2020-03-21 NOTE — Progress Notes (Addendum)
On 03/19/20 patient completed Core Session 6 of Diabetes Prevention Program course virtually with [facility]. The following learning objectives were met by the patient during this class:   Virtual Visit via Video Note  I connected with Grantland Want on 03/19/20 at 11:30 AM EST by a video enabled application and verified that I am speaking with the correct person using two identifiers.  Location: Patient: Home.  Provider: Office.   Learning Objectives:  Graph their daily physical activity.   Describe two ways of finding the time to be active.   Define "lifestyle activity."   Describe how to prevent injury.   Develop an activity plan for the coming week.   Goals:   Record weight taken outside of class.   Track foods and beverages eaten each day in the "Food and Activity Tracker," including calories and fat grams for each item.    Track activity type, minutes you were active, and distance you reached each day in the "Food and Activity Tracker."   Set aside one 20 to 30-minute block of time every day or find two or more periods of 10 to15 minutes each for physical activity.   Warm up, cool down, and stretch.  Make a Physical Activities Plan for the Week.   Follow-Up Plan:  Attend Core Session 7 next week.   E-mail completed "Food and Activity Tracker" to Lifestyle Coach next week before class

## 2020-03-22 DIAGNOSIS — C61 Malignant neoplasm of prostate: Secondary | ICD-10-CM | POA: Diagnosis not present

## 2020-03-26 ENCOUNTER — Encounter (HOSPITAL_BASED_OUTPATIENT_CLINIC_OR_DEPARTMENT_OTHER): Payer: Medicare Other | Admitting: Registered"

## 2020-03-26 DIAGNOSIS — R7303 Prediabetes: Secondary | ICD-10-CM

## 2020-03-27 ENCOUNTER — Encounter: Payer: Self-pay | Admitting: Registered"

## 2020-03-27 NOTE — Progress Notes (Signed)
On 03/26/20 patient completed Core Session 7 of Diabetes Prevention Program course virtually with [facility]. The following learning objectives were met by the patient during this class:   Virtual Visit via Video Note  I connected with Jesse Goodwin by a video enabled application and verified that I am speaking with the correct person using two identifiers.  Location: Patient: Home. Provider: Office.   Learning Objectives:  Define calorie balance.  Explain how healthy eating and being active are related in terms of calorie balance.   Describe the relationship between calorie balance and weight loss.   Describe his or her progress as it relates to calorie balance.   Develop an activity plan for the coming week.   Goals:   Record weight taken outside of class.   Track foods and beverages eaten each day in the "Food and Activity Tracker," including calories and fat grams for each item.    Track activity type, minutes you were active, and distance you reached each day in the "Food and Activity Tracker."   Set aside one 20 to 30-minute block of time every day or find two or more periods of 10 to15 minutes each for physical activity.   Make a Physical Activities Plan for the Week.   Make active lifestyle choices all through the day   Stay at or go slightly over activity goal.   Follow-Up Plan:  Attend Core Session 8 next week.   E-mail completed "Food and Activity Tracker" to Lifestyle Coach next week before class

## 2020-03-29 DIAGNOSIS — R9721 Rising PSA following treatment for malignant neoplasm of prostate: Secondary | ICD-10-CM | POA: Diagnosis not present

## 2020-03-29 DIAGNOSIS — C61 Malignant neoplasm of prostate: Secondary | ICD-10-CM | POA: Diagnosis not present

## 2020-04-01 NOTE — Progress Notes (Addendum)
[facility] Note  04/05/2020     CHIEF COMPLAINT Patient presents for Retina Evaluation   HISTORY OF PRESENT ILLNESS: Jesse Goodwin is a 75 y.o. male who presents to the clinic today for:   HPI    Retina Evaluation    In left eye.  I, the attending physician,  performed the HPI with the patient and updated documentation appropriately.          Comments    Pt is here on the referral of Dr. Valetta Close for concern of VMT OS, pt states Dr. Valetta Close has done cataract sx OU, he states the left eye sx resulted in the retina detaching "somewhat" and states his left eye vision is "compromised", pt states sx was over a year ago, pt denies fol or floaters, pt uses Systane QAM       Last edited by Bernarda Caffey, MD on 04/05/2020  1:29 PM. (History)    pt is here on the referral of Dr. Valetta Close for concern of ERM OS, pt states he had cataract sx in the left eye with Dr. Valetta Close in either 2020 or 2021 and Dr. Valetta Close has been following his retinal changes since then, pt states it has stayed the same for the last year to a year and a half  Referring physician: Jola Schmidt, MD [place],  [place] [id number]  HISTORICAL INFORMATION:   Selected notes from the MEDICAL RECORD NUMBER Referred by Dr. Valetta Close for eval of VMT OS LEE:  Ocular Hx- PMH-    CURRENT MEDICATIONS: Current Outpatient Medications (Ophthalmic Drugs)  Medication Sig  . Bromfenac Sodium (PROLENSA) 0.07 % SOLN Place 1 drop into the left eye 4 (four) times daily.  . prednisoLONE acetate (PRED FORTE) 1 % ophthalmic suspension Place 1 drop into the left eye 4 (four) times daily.  . Tetrahydrozoline HCl (VISINE OP) Place 2 drops into both eyes daily as needed (dry eye).   No current facility-administered medications for this visit. (Ophthalmic Drugs)   Current Outpatient Medications (Other)  Medication Sig  . amLODipine (NORVASC) 10 MG tablet Take 10 mg by mouth daily.  Marland Kitchen aspirin 325 MG EC tablet Take  325 mg by mouth daily.  Marland Kitchen azelastine (ASTELIN) 0.1 % nasal spray   . losartan (COZAAR) 100 MG tablet   . simvastatin (ZOCOR) 20 MG tablet Take 20 mg by mouth at bedtime.  . Triamcinolone Acetonide (NASACORT ALLERGY 24HR NA) Place into the nose daily. 1 spray each nostril  . Triamcinolone Acetonide (NASACORT AQ NA) 1 spray in each nostril   No current facility-administered medications for this visit. (Other)      REVIEW OF SYSTEMS: ROS    Positive for: Cardiovascular, Eyes   Negative for: Constitutional, Gastrointestinal, Neurological, Skin, Genitourinary, Musculoskeletal, HENT, Endocrine, Respiratory, Psychiatric, Allergic/Imm, Heme/Lymph   Last edited by Debbrah Alar, COT on 04/05/2020  1:19 PM. (History)       ALLERGIES No Known Allergies  PAST MEDICAL HISTORY Past Medical History:  Diagnosis Date  . Hypercholesteremia   . Hypertension   . Pre-diabetes    pre-dm   . Prostate cancer ([place])   . 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 CAROTID ENDARTERECTOMY     2014 FOR TREATMENT OF STROKE   . CATARACT EXTRACTION    . CHOLECYSTECTOMY     living in [place] at this time, does  not remember when or who did it   . PROSTATE BIOPSY  2019  . RADIOACTIVE SEED IMPLANT N/A 04/30/2017   Procedure: RADIOACTIVE SEED IMPLANT/BRACHYTHERAPY IMPLANT;  Surgeon: Cleon Gustin, MD;  Location: [facility];  Service: Urology;  Laterality: N/A;  . SPACE OAR INSTILLATION N/A 04/30/2017   Procedure: SPACE OAR INSTILLATION;  Surgeon: Cleon Gustin, MD;  Location: [facility];  Service: Urology;  Laterality: N/A;    FAMILY HISTORY Family History  Problem Relation Age of Onset  . Stroke Mother   . Breast cancer Mother   . Dementia Mother   . CAD Mother   . Diabetes Mellitus II Mother   . Cancer Mother        breast  . Diabetes Mother   . Heart disease Mother   . Hyperlipidemia  Mother   . Hypertension Mother   . Hyperlipidemia Brother   . Hyperlipidemia Son   . Stroke Sister     SOCIAL HISTORY Social History   Tobacco Use  . Smoking status: Never Smoker  . Smokeless tobacco: Never Used  Vaping Use  . Vaping Use: Never used  Substance Use Topics  . Alcohol use: Yes    Alcohol/week: 14.0 standard drinks    Types: 7 Glasses of wine, 7 Shots of liquor per week    Comment: drinks mixed drinks and wine in the night.  . Drug use: No         OPHTHALMIC EXAM:  Base Eye Exam    Visual Acuity (Snellen - Linear)      Right Left   Dist cc 20/30 -2 20/50   Dist ph cc 20/20 -2 20/40 -1   Correction: Glasses       Tonometry (Tonopen, 1:25 PM)      Right Left   Pressure 13 15       Pupils      Dark Light Shape React APD   Right 4 3 Round Brisk None   Left 4 3 Round Brisk None       Visual Fields (Counting fingers)      Left Right    Full Full       Extraocular Movement      Right Left    Full, Ortho Full, Ortho       Neuro/Psych    Oriented x3: Yes   Mood/Affect: Normal       Dilation    Both eyes: 1.0% Mydriacyl, 2.5% Phenylephrine @ 1:25 PM        Slit Lamp and Fundus Exam    Slit Lamp Exam      Right Left   Lids/Lashes Dermatochalasis - upper lid, mild MGD Dermatochalasis - upper lid, mild MGD   Conjunctiva/Sclera Trace Injection Trace Injection   Cornea arcus, well healed temporal cataract wounds, 1-2+ Punctate epithelial erosions, mild tear film debris arcus, well healed temporal cataract wounds   Anterior Chamber Deep and quiet Deep and quiet   Iris Round and dilated Round and dilated   Lens PC IOL in good position PC IOL in good position   Vitreous clear Mild Asteroid hyalosis       Fundus Exam      Right Left   Disc Pink and Sharp Pink and Sharp   C/D Ratio 0.6 0.5   Macula Flat, Blunted foveal reflex, mild RPE mottling, No heme or edema Flat, hazy view, Blunted foveal reflex, ?small lamellar hole, cystic changes  centrally, no heme  Vessels Normal mild attenuation   Periphery Attached, reticular degeneration, peripheral drusen Attached, reticular degeneration, peripheral drusen, hypopigmented CR scar at 0900        Refraction    Wearing Rx      Sphere Cylinder Axis Add   Right -0.75   +2.50   Left [place] +1.00 140 2.50          IMAGING AND PROCEDURES  Imaging and Procedures for 04/05/2020  OCT, Retina - OU - Both Eyes       Right Eye Quality was good. Central Foveal Thickness: 270. Progression has no prior data. Findings include normal foveal contour, no IRF, no SRF, vitreomacular adhesion .   Left Eye Quality was good. Central Foveal Thickness: 452. Progression has no prior data. Findings include abnormal foveal contour, intraretinal fluid, lamellar hole, no SRF, epiretinal membrane, macular pucker (ERM with lamellar hole and CME, partial PVD).   Notes *Images captured and stored on drive  Diagnosis / Impression:  OD: NFP, no IRF/SRF OS: ERM with lamellar hole and CME, partial PVD  Clinical management:  See below  Abbreviations: NFP - Normal foveal profile. CME - cystoid macular edema. PED - pigment epithelial detachment. IRF - intraretinal fluid. SRF - subretinal fluid. EZ - ellipsoid zone. ERM - epiretinal membrane. ORA - outer retinal atrophy. ORT - outer retinal tubulation. SRHM - subretinal hyper-reflective material. IRHM - intraretinal hyper-reflective material                 ASSESSMENT/PLAN:    ICD-10-CM   1. Epiretinal membrane (ERM) of left eye  H35.372   2. Retinal edema  H35.81 OCT, Retina - OU - Both Eyes  3. 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 Essential hypertension  I10   7. Hypertensive retinopathy of both eyes  H35.033   8. Pseudophakia of both eyes  Z96.1     1-4. Epiretinal membrane with lamellar hole and CME - mild ERM OS w/ central lamellar hole  and surrounding cystic changes -- ?post op CME component - OCT shows fibrotic partial PVD OS - BCVA 20/40- - discussed findings, prognosis, and treatment options - no indication for ERM surgery at this time - recommend starting PF and Prolensa QID OS to treat any post op CME component -- pt in agreement - f/u 3-4 weeks -- DFE/OCT  5. Asteroid hyalosis OS  - discussed diagnosis, prognosis and benign nature  - monitor  6,7. Hypertensive retinopathy OU - discussed importance of tight BP control - monitor  8. Pseudophakia OU  - s/p CE/IOL OU (Dr. Valetta Close)  - IOLs in good position - monitor    Ophthalmic Meds Ordered this visit:  Meds ordered this encounter  Medications  . prednisoLONE acetate (PRED FORTE) 1 % ophthalmic suspension    Sig: Place 1 drop into the left eye 4 (four) times daily.    Dispense:  15 mL    Refill:  0  . Bromfenac Sodium (PROLENSA) 0.07 % SOLN    Sig: Place 1 drop into the left eye 4 (four) times daily.    Dispense:  6 mL    Refill:  3       Return for f/u 3-4 weeks, ERM / CME.  There are no Patient Instructions on file for this visit.  This document serves as a record of services personally performed by Gardiner Sleeper,  MD, PhD. It was created on their behalf by Leeann Must, [place], an ophthalmic technician. The creation of this record is the provider's dictation and/or activities during the visit.    Electronically signed by: Leeann Must, COA @TODAY @ 1:51 AM  Gardiner Sleeper, M.D., Ph.D. Diseases & Surgery of the Retina and [place] 04/05/2020   I have reviewed the above documentation for accuracy and completeness, and I agree with the above. Gardiner Sleeper, M.D., Ph.D. 04/06/20 1:51 AM   Abbreviations: M myopia (nearsighted); A astigmatism; H hyperopia (farsighted); P presbyopia; Mrx spectacle prescription;  CTL contact lenses; OD right eye; OS left eye; OU both eyes  XT exotropia; ET esotropia; PEK  punctate epithelial keratitis; PEE punctate epithelial erosions; DES dry eye syndrome; MGD meibomian gland dysfunction; ATs artificial tears; PFAT's preservative free artificial tears; [place] nuclear sclerotic cataract; PSC posterior subcapsular cataract; ERM epi-retinal membrane; PVD posterior vitreous detachment; RD retinal detachment; DM diabetes mellitus; DR diabetic retinopathy; NPDR non-proliferative diabetic retinopathy; PDR proliferative diabetic retinopathy; CSME clinically significant macular edema; DME diabetic macular edema; dbh dot blot hemorrhages; CWS cotton wool spot; POAG primary open angle glaucoma; C/D cup-to-disc ratio; HVF humphrey visual field; GVF goldmann visual field; OCT optical coherence tomography; IOP intraocular pressure; BRVO Branch retinal vein occlusion; CRVO central retinal vein occlusion; CRAO central retinal artery occlusion; BRAO branch retinal artery occlusion; RT retinal tear; SB scleral buckle; PPV pars plana vitrectomy; VH Vitreous hemorrhage; PRP panretinal laser photocoagulation; IVK intravitreal kenalog; VMT vitreomacular traction; MH Macular hole;  NVD neovascularization of the disc; NVE neovascularization elsewhere; AREDS age related eye disease study; ARMD age related macular degeneration; POAG primary open angle glaucoma; EBMD epithelial/anterior basement membrane dystrophy; ACIOL anterior chamber intraocular lens; IOL intraocular lens; PCIOL posterior chamber intraocular lens; Phaco/IOL phacoemulsification with intraocular lens placement; [place] photorefractive keratectomy; LASIK laser assisted in situ keratomileusis; HTN hypertension; DM diabetes mellitus; COPD chronic obstructive pulmonary disease

## 2020-04-02 ENCOUNTER — Encounter: Payer: Self-pay | Admitting: Registered"

## 2020-04-02 ENCOUNTER — Encounter (HOSPITAL_BASED_OUTPATIENT_CLINIC_OR_DEPARTMENT_OTHER): Payer: Medicare Other | Admitting: Registered"

## 2020-04-02 DIAGNOSIS — R7303 Prediabetes: Secondary | ICD-10-CM | POA: Diagnosis not present

## 2020-04-02 NOTE — Progress Notes (Signed)
On 04/02/20 patient completed Core Session 8 of Diabetes Prevention Program course virtually with [facility]. The following learning objectives were met by the patient during this class:   Virtual Visit via Video Note  I connected with Jesse Goodwin on 04/02/20 at 11:30 AM EDT by a video enabled application and verified that I am speaking with the correct person using two identifiers.  Location: Patient: Home.  Provider: Office.   Learning Objectives:  Recognize positive and negative food and activity cues.   Change negative food and activity cues to positive cues.   Add positive cues for activity and eliminate cues for inactivity.   Develop a plan for removing one problem food cue for the coming week.   Goals:   Record weight taken outside of class.   Track foods and beverages eaten each day in the "Food and Activity Tracker," including calories and fat grams for each item.    Track activity type, minutes you were active, and distance you reached each day in the "Food and Activity Tracker."   Set aside one 20 to 30-minute block of time every day or find two or more periods of 10 to15 minutes each for physical activity.   Remove one problem food cue.   Add one positive cue for being more active.  Follow-Up Plan: . Attend Core Session 9 next week.  . Email completed "Food and Activity Tracker" next week to be reviewed by Lifestyle Coach.

## 2020-04-05 ENCOUNTER — Encounter (INDEPENDENT_AMBULATORY_CARE_PROVIDER_SITE_OTHER): Payer: Self-pay | Admitting: Ophthalmology

## 2020-04-05 ENCOUNTER — Ambulatory Visit (INDEPENDENT_AMBULATORY_CARE_PROVIDER_SITE_OTHER): Payer: Medicare Other | Admitting: Ophthalmology

## 2020-04-05 ENCOUNTER — Other Ambulatory Visit: Payer: Self-pay

## 2020-04-05 DIAGNOSIS — H3581 Retinal edema: Secondary | ICD-10-CM | POA: Diagnosis not present

## 2020-04-05 DIAGNOSIS — H4322 Crystalline deposits in vitreous body, left eye: Secondary | ICD-10-CM

## 2020-04-05 DIAGNOSIS — H35372 Puckering of macula, left eye: Secondary | ICD-10-CM

## 2020-04-05 DIAGNOSIS — H35342 Macular cyst, hole, or pseudohole, left eye: Secondary | ICD-10-CM | POA: Diagnosis not present

## 2020-04-05 DIAGNOSIS — Z961 Presence of intraocular lens: Secondary | ICD-10-CM

## 2020-04-05 DIAGNOSIS — I10 Essential (primary) hypertension: Secondary | ICD-10-CM

## 2020-04-05 DIAGNOSIS — H25813 Combined forms of age-related cataract, bilateral: Secondary | ICD-10-CM

## 2020-04-05 DIAGNOSIS — H35352 Cystoid macular degeneration, left eye: Secondary | ICD-10-CM | POA: Diagnosis not present

## 2020-04-05 DIAGNOSIS — H35033 Hypertensive retinopathy, bilateral: Secondary | ICD-10-CM | POA: Diagnosis not present

## 2020-04-05 MED ORDER — PROLENSA 0.07 % OP SOLN: 1.0000 [drp] | Freq: Four times a day (QID) | OPHTHALMIC | 3 refills | Status: DC

## 2020-04-05 MED ORDER — PREDNISOLONE ACETATE 1 % OP SUSP: 1.0000 [drp] | Freq: Four times a day (QID) | OPHTHALMIC | 0 refills | Status: DC

## 2020-04-08 ENCOUNTER — Other Ambulatory Visit (HOSPITAL_COMMUNITY): Payer: Self-pay | Admitting: Urology

## 2020-04-08 DIAGNOSIS — C61 Malignant neoplasm of prostate: Secondary | ICD-10-CM

## 2020-04-09 ENCOUNTER — Encounter (HOSPITAL_BASED_OUTPATIENT_CLINIC_OR_DEPARTMENT_OTHER): Payer: Medicare Other | Admitting: Registered"

## 2020-04-09 DIAGNOSIS — R7303 Prediabetes: Secondary | ICD-10-CM

## 2020-04-14 ENCOUNTER — Encounter: Payer: Self-pay | Admitting: Registered"

## 2020-04-14 NOTE — Progress Notes (Signed)
On 04/09/20 patient completed Core Session 9 of Diabetes Prevention Program course virtually with [facility]. The following learning objectives were met by the patient during this class:   Virtual Visit via Video Note  I connected with Jesse Goodwin on 04/09/20 by a video enabled application and verified that I am speaking with the correct person using two identifiers.  Location: Patient: Home.  Provider: Office.   Learning Objectives:  List and describe five steps to problem solving.   Apply the five problem solving steps to resolve a problem he or she has with eating less fat and fewer calories or being more active.   Goals:   Record weight taken outside of class.   Track foods and beverages eaten each day in the "Food and Activity Tracker," including calories and fat grams for each item.    Track activity type, minutes you were active, and distance you reached each day in the "Food and Activity Tracker."   Set aside one 20 to 30-minute block of time every day or find two or more periods of 10 to15 minutes each for physical activity.   Use problem solving action plan created during session to problem solve.   Follow-Up Plan:  Attend Core Session 10 next week.   Email completed "Food and Activity Tracker" next week to be reviewed by Lifestyle Coach.  Email menus from favorite restaurants to next session for future discussion.

## 2020-04-16 ENCOUNTER — Encounter: Payer: Self-pay | Admitting: Registered"

## 2020-04-16 ENCOUNTER — Encounter: Payer: Medicare Other | Attending: Family Medicine | Admitting: Registered"

## 2020-04-16 DIAGNOSIS — R7303 Prediabetes: Secondary | ICD-10-CM | POA: Diagnosis not present

## 2020-04-16 NOTE — Progress Notes (Signed)
On 04/16/20 patient completed Core Session 10 of Diabetes Prevention Program course virtually with [facility]. The following learning objectives were met by the patient during this class:   Virtual Visit via Video Note  I connected with Jesse Goodwin by a video enabled application and verified that I am speaking with the correct person using two identifiers.  Location: Patient: Home. Provider: Office.   Learning Objectives:  List and describe the four keys for healthy eating out.   Give examples of how to apply these keys at the type of restaurants that the participants go to regularly.   Make an appropriate meal selection from a restaurant menu.   Demonstrate how to ask for a substitute item using assertive language and a polite tone of voice.    Goals:   Record weight taken outside of class.   Track foods and beverages eaten each day in the "Food and Activity Tracker," including calories and fat grams for each item.    Track activity type, minutes you were active, and distance you reached each day in the "Food and Activity Tracker."   Set aside one 20 to 30-minute block of time every day or find two or more periods of 10 to15 minutes each for physical activity.   Utilize positive action plan and complete questions on "To Do List."   Follow-Up Plan:  Attend Core Session 11.   Email completed "Food and Activity Tracker" to be reviewed by Lifestyle Coach.

## 2020-04-22 ENCOUNTER — Encounter (HOSPITAL_COMMUNITY): Payer: Medicare Other

## 2020-04-22 ENCOUNTER — Encounter (HOSPITAL_COMMUNITY): Payer: Self-pay

## 2020-04-23 ENCOUNTER — Encounter: Payer: Self-pay | Admitting: Registered"

## 2020-04-23 ENCOUNTER — Encounter (HOSPITAL_BASED_OUTPATIENT_CLINIC_OR_DEPARTMENT_OTHER): Payer: Medicare Other | Admitting: Registered"

## 2020-04-23 DIAGNOSIS — R7303 Prediabetes: Secondary | ICD-10-CM

## 2020-04-26 ENCOUNTER — Encounter: Payer: Self-pay | Admitting: Registered"

## 2020-04-26 NOTE — Progress Notes (Signed)
On 04/23/20 patient completed Session 11 of Diabetes Prevention Program course virtually with [facility]. By the end of this session patients are able to complete the following objectives:   Virtual Visit via Video Note  I connected with Rucker Pridgeon by a video enabled application and verified that I am speaking with the correct person using two identifiers.  Location: Patient: Home.  Provider: Office.   Learning Objectives:  Give examples of negative thoughts that could prevent them from meeting their goals of losing weight and being more physically active.   Describe how to stop negative thoughts and talk back to them with positive thoughts.   Practice 1) stopping negative thoughts and 2) talking back to negative thoughts with positive ones.    Goals:   Record weight taken outside of class.   Track foods and beverages eaten each day in the "Food and Activity Tracker," including calories and fat grams for each item.    Track activity type, minutes you were active, and distance you reached each day in the "Food and Activity Tracker."   If you have any negative thoughts-write them in your Food and Activity Trackers, along with how you talked back to them. Practice stopping negative thoughts and talking back to them with positive thoughts.   Follow-Up Plan:  Attend Core Session 12.   Email completed "Food and Activity Tracker" before next week to be reviewed by Lifestyle Coach.

## 2020-04-27 ENCOUNTER — Ambulatory Visit (HOSPITAL_COMMUNITY)
Admission: RE | Admit: 2020-04-27 | Discharge: 2020-04-27 | Disposition: A | Discharge status: Home / Self Care | Payer: Medicare Other | Source: Ambulatory Visit | Attending: Urology | Admitting: Urology

## 2020-04-27 ENCOUNTER — Encounter (HOSPITAL_COMMUNITY)
Admission: RE | Admit: 2020-04-27 | Discharge: 2020-04-27 | Disposition: A | Discharge status: Home / Self Care | Payer: Medicare Other | Source: Ambulatory Visit | Attending: Urology | Admitting: Urology

## 2020-04-27 ENCOUNTER — Other Ambulatory Visit: Payer: Self-pay

## 2020-04-27 DIAGNOSIS — C61 Malignant neoplasm of prostate: Secondary | ICD-10-CM | POA: Insufficient documentation

## 2020-04-27 DIAGNOSIS — M19011 Primary osteoarthritis, right shoulder: Secondary | ICD-10-CM | POA: Diagnosis not present

## 2020-04-27 DIAGNOSIS — M47816 Spondylosis without myelopathy or radiculopathy, lumbar region: Secondary | ICD-10-CM | POA: Diagnosis not present

## 2020-04-27 DIAGNOSIS — R9721 Rising PSA following treatment for malignant neoplasm of prostate: Secondary | ICD-10-CM | POA: Diagnosis not present

## 2020-04-27 DIAGNOSIS — M170 Bilateral primary osteoarthritis of knee: Secondary | ICD-10-CM | POA: Diagnosis not present

## 2020-04-27 MED ORDER — TECHNETIUM TC 99M MEDRONATE IV KIT
21.9000 | PACK | Freq: Once | INTRAVENOUS | Status: AC | PRN
  Administered 2020-04-27: 21.9 via INTRAVENOUS

## 2020-04-29 NOTE — Progress Notes (Signed)
[facility] Note  05/03/2020     CHIEF COMPLAINT Patient presents for Retina Follow Up   HISTORY OF PRESENT ILLNESS: Jesse Goodwin is a 75 y.o. male who presents to the clinic today for:   HPI    Retina Follow Up    Patient presents with  Other.  In left eye.  This started 4 weeks ago.  I, the attending physician,  performed the HPI with the patient and updated documentation appropriately.          Comments    Patient here for 4 weeks retina follow up for ERM/ CME OS. Patient states vision about the same. The drops helped a lot. Feels a lot better. No eye pain.        Last edited by Bernarda Caffey, MD on 05/03/2020  4:26 PM. (History)    pt states vision is the same, he states the drips have helped his eyes feels better  Referring physician: Cari Caraway, MD [place],  [place] [id number]  HISTORICAL INFORMATION:   Selected notes from the MEDICAL RECORD NUMBER Referred by Dr. Valetta Close for eval of VMT OS LEE:  Ocular Hx- PMH-    CURRENT MEDICATIONS: Current Outpatient Medications (Ophthalmic Drugs)  Medication Sig  . Bromfenac Sodium (PROLENSA) 0.07 % SOLN Place 1 drop into the left eye 4 (four) times daily.  . prednisoLONE acetate (PRED FORTE) 1 % ophthalmic suspension Place 1 drop into the left eye 4 (four) times daily.  . Tetrahydrozoline HCl (VISINE OP) Place 2 drops into both eyes daily as needed (dry eye).   No current facility-administered medications for this visit. (Ophthalmic Drugs)   Current Outpatient Medications (Other)  Medication Sig  . amLODipine (NORVASC) 10 MG tablet Take 10 mg by mouth daily.  Marland Kitchen aspirin 325 MG EC tablet Take 325 mg by mouth daily.  Marland Kitchen azelastine (ASTELIN) 0.1 % nasal spray   . losartan (COZAAR) 100 MG tablet   . simvastatin (ZOCOR) 20 MG tablet Take 20 mg by mouth at bedtime.  . Triamcinolone Acetonide (NASACORT ALLERGY 24HR NA) Place into the nose daily. 1 spray each nostril  .  Triamcinolone Acetonide (NASACORT AQ NA) 1 spray in each nostril   No current facility-administered medications for this visit. (Other)      REVIEW OF SYSTEMS: ROS    Positive for: Cardiovascular, Eyes   Negative for: Constitutional, Gastrointestinal, Neurological, Skin, Genitourinary, Musculoskeletal, HENT, Endocrine, Respiratory, Psychiatric, Allergic/Imm, Heme/Lymph   Last edited by Theodore Demark, COA on 05/03/2020  1:06 PM. (History)       ALLERGIES No Known Allergies  PAST MEDICAL HISTORY Past Medical History:  Diagnosis Date  . Hypercholesteremia   . Hypertension   . Pre-diabetes    pre-dm   . Prostate cancer ([place])   . 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 CAROTID ENDARTERECTOMY     2014 FOR TREATMENT OF STROKE   . CATARACT EXTRACTION    . CHOLECYSTECTOMY     living in [place] at this time, does not remember when or who did it   . PROSTATE BIOPSY  2019  . RADIOACTIVE SEED IMPLANT N/A 04/30/2017   Procedure: RADIOACTIVE SEED IMPLANT/BRACHYTHERAPY IMPLANT;  Surgeon: Cleon Gustin, MD;  Location: [facility];  Service: Urology;  Laterality: N/A;  . SPACE OAR INSTILLATION N/A 04/30/2017   Procedure: SPACE OAR INSTILLATION;  Surgeon: Cleon Gustin, MD;  Location: [facility];  Service: Urology;  Laterality: N/A;    FAMILY HISTORY Family History  Problem Relation Age of Onset  . Stroke Mother   . Breast cancer Mother   . Dementia Mother   . CAD Mother   . Diabetes Mellitus II Mother   . Cancer Mother        breast  . Diabetes Mother   . Heart disease Mother   . Hyperlipidemia Mother   . Hypertension Mother   . Hyperlipidemia Brother   . Hyperlipidemia Son   . Stroke Sister     SOCIAL HISTORY Social History   Tobacco Use  . Smoking status: Never Smoker  . Smokeless tobacco: Never Used  Vaping Use  . Vaping Use: Never used  Substance  Use Topics  . Alcohol use: Yes    Alcohol/week: 14.0 standard drinks    Types: 7 Glasses of wine, 7 Shots of liquor per week    Comment: drinks mixed drinks and wine in the night.  . Drug use: No         OPHTHALMIC EXAM:  Base Eye Exam    Visual Acuity (Snellen - Linear)      Right Left   Dist cc 20/20 -1 20/40 -1   Dist ph cc  NI       Tonometry (Tonopen, 1:03 PM)      Right Left   Pressure 15 20       Pupils      Dark Light Shape React APD   Right 4 3 Round Brisk None   Left 4 3 Round Brisk None       Visual Fields (Counting fingers)      Left Right    Full        Extraocular Movement      Right Left    Full, Ortho Full, Ortho       Neuro/Psych    Oriented x3: Yes   Mood/Affect: Normal       Dilation    Both eyes: 1.0% Mydriacyl, 2.5% Phenylephrine @ 1:03 PM        Slit Lamp and Fundus Exam    Slit Lamp Exam      Right Left   Lids/Lashes Dermatochalasis - upper lid, mild MGD Dermatochalasis - upper lid, mild MGD   Conjunctiva/Sclera Trace Injection Trace Injection   Cornea arcus, well healed temporal cataract wounds, 1-2+ Punctate epithelial erosions, mild tear film debris arcus, well healed temporal cataract wounds   Anterior Chamber Deep and quiet Deep and quiet   Iris Round and dilated Round and dilated   Lens PC IOL in good position PC IOL in good position   Vitreous clear Mild Asteroid hyalosis, Vitreous syneresis       Fundus Exam      Right Left   Disc Pink and Sharp Pink and Sharp   C/D Ratio 0.6 0.5   Macula Flat, Blunted foveal reflex, mild RPE mottling, No heme or edema Flat, hazy view, Blunted foveal reflex, small lamellar hole, cystic changes centrally -- slightly improved, no heme   Vessels Normal mild attenuation, mild tortuousity   Periphery Attached, reticular degeneration, peripheral drusen Attached, reticular degeneration, peripheral drusen, hypopigmented CR scar at 0900        Refraction    Wearing Rx      Sphere  Cylinder Axis Add   Right -0.75   +2.50   Left [place] +1.00 140 2.50  IMAGING AND PROCEDURES  Imaging and Procedures for 05/03/2020  OCT, Retina - OU - Both Eyes       Right Eye Quality was good. Central Foveal Thickness: 269. Progression has been stable. Findings include normal foveal contour, no IRF, no SRF, vitreomacular adhesion .   Left Eye Quality was good. Central Foveal Thickness: 399. Progression has improved. Findings include abnormal foveal contour, intraretinal fluid, lamellar hole, no SRF, epiretinal membrane, macular pucker (Mild interval improvement in central cystic changes).   Notes *Images captured and stored on drive  Diagnosis / Impression:  OD: NFP, no IRF/SRF OS: Mild interval improvement in central cystic changes  Clinical management:  See below  Abbreviations: NFP - Normal foveal profile. CME - cystoid macular edema. PED - pigment epithelial detachment. IRF - intraretinal fluid. SRF - subretinal fluid. EZ - ellipsoid zone. ERM - epiretinal membrane. ORA - outer retinal atrophy. ORT - outer retinal tubulation. SRHM - subretinal hyper-reflective material. IRHM - intraretinal hyper-reflective material                 ASSESSMENT/PLAN:    ICD-10-CM   1. Epiretinal membrane (ERM) of left eye  H35.372   2. Retinal edema  H35.81 OCT, Retina - OU - Both Eyes  3. 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 Essential hypertension  I10   7. Hypertensive retinopathy of both eyes  H35.033   8. Pseudophakia of both eyes  Z96.1     1-4. Epiretinal membrane with lamellar hole and CME OS - mild ERM OS w/ central lamellar hole and surrounding cystic changes -- ?post op CME component - OCT shows interval improvement in cystic changes - BCVA 20/40- stable - discussed findings, prognosis, and treatment options - no indication for ERM surgery at this time -  differential diagnosis for central cystic changes includes foveoschisis-- wil check FA next time - continue PF and Prolensa QID OS to treat any post op CME component - f/u 5 weeks -- DFE/OCT, FA transit OS  5. Asteroid hyalosis OS  - discussed diagnosis, prognosis and benign nature  - monitor  6,7. Hypertensive retinopathy OU - discussed importance of tight BP control - monitor  8. Pseudophakia OU  - s/p CE/IOL OU (Dr. Valetta Close)  - IOLs in good position - monitor    Ophthalmic Meds Ordered this visit:  No orders of the defined types were placed in this encounter.      Return in about 5 weeks (around 06/07/2020) for f/u ERM OS, DFE, OCT, Fluorescein Angiogram.  There are no Patient Instructions on file for this visit.  This document serves as a record of services personally performed by Gardiner Sleeper, MD, PhD. It was created on their behalf by Leeann Must, [place], an ophthalmic technician. The creation of this record is the provider's dictation and/or activities during the visit.    Electronically signed by: Leeann Must, COA @TODAY @ 4:30 PM   This document serves as a record of services personally performed by Gardiner Sleeper, MD, PhD. It was created on their behalf by San Jetty. Owens Shark, OA an ophthalmic technician. The creation of this record is the provider's dictation and/or activities during the visit.    Electronically signed by: San Jetty. Owens Shark, [place] 04.18.2022 4:30 PM  Gardiner Sleeper, M.D., Ph.D. Diseases & Surgery of the Retina and [place]  05/03/2020   I have reviewed the above documentation for accuracy and completeness, and I agree with the above. Gardiner Sleeper, M.D., Ph.D. 05/03/20 4:30 PM   Abbreviations: M myopia (nearsighted); A astigmatism; H hyperopia (farsighted); P presbyopia; Mrx spectacle prescription;  CTL contact lenses; OD right eye; OS left eye; OU both eyes  XT exotropia; ET esotropia; PEK punctate epithelial  keratitis; PEE punctate epithelial erosions; DES dry eye syndrome; MGD meibomian gland dysfunction; ATs artificial tears; PFAT's preservative free artificial tears; [place] nuclear sclerotic cataract; PSC posterior subcapsular cataract; ERM epi-retinal membrane; PVD posterior vitreous detachment; RD retinal detachment; DM diabetes mellitus; DR diabetic retinopathy; NPDR non-proliferative diabetic retinopathy; PDR proliferative diabetic retinopathy; CSME clinically significant macular edema; DME diabetic macular edema; dbh dot blot hemorrhages; CWS cotton wool spot; POAG primary open angle glaucoma; C/D cup-to-disc ratio; HVF humphrey visual field; GVF goldmann visual field; OCT optical coherence tomography; IOP intraocular pressure; BRVO Branch retinal vein occlusion; CRVO central retinal vein occlusion; CRAO central retinal artery occlusion; BRAO branch retinal artery occlusion; RT retinal tear; SB scleral buckle; PPV pars plana vitrectomy; VH Vitreous hemorrhage; PRP panretinal laser photocoagulation; IVK intravitreal kenalog; VMT vitreomacular traction; MH Macular hole;  NVD neovascularization of the disc; NVE neovascularization elsewhere; AREDS age related eye disease study; ARMD age related macular degeneration; POAG primary open angle glaucoma; EBMD epithelial/anterior basement membrane dystrophy; ACIOL anterior chamber intraocular lens; IOL intraocular lens; PCIOL posterior chamber intraocular lens; Phaco/IOL phacoemulsification with intraocular lens placement; [place] photorefractive keratectomy; LASIK laser assisted in situ keratomileusis; HTN hypertension; DM diabetes mellitus; COPD chronic obstructive pulmonary disease

## 2020-05-03 ENCOUNTER — Other Ambulatory Visit: Payer: Self-pay

## 2020-05-03 ENCOUNTER — Ambulatory Visit (INDEPENDENT_AMBULATORY_CARE_PROVIDER_SITE_OTHER): Payer: Medicare Other | Admitting: Ophthalmology

## 2020-05-03 ENCOUNTER — Encounter (INDEPENDENT_AMBULATORY_CARE_PROVIDER_SITE_OTHER): Payer: Self-pay | Admitting: Ophthalmology

## 2020-05-03 DIAGNOSIS — H3581 Retinal edema: Secondary | ICD-10-CM | POA: Diagnosis not present

## 2020-05-03 DIAGNOSIS — H35372 Puckering of macula, left eye: Secondary | ICD-10-CM | POA: Diagnosis not present

## 2020-05-03 DIAGNOSIS — H4322 Crystalline deposits in vitreous body, left eye: Secondary | ICD-10-CM

## 2020-05-03 DIAGNOSIS — I10 Essential (primary) hypertension: Secondary | ICD-10-CM

## 2020-05-03 DIAGNOSIS — H35033 Hypertensive retinopathy, bilateral: Secondary | ICD-10-CM | POA: Diagnosis not present

## 2020-05-03 DIAGNOSIS — H35352 Cystoid macular degeneration, left eye: Secondary | ICD-10-CM | POA: Diagnosis not present

## 2020-05-03 DIAGNOSIS — Z961 Presence of intraocular lens: Secondary | ICD-10-CM | POA: Diagnosis not present

## 2020-05-03 DIAGNOSIS — H35342 Macular cyst, hole, or pseudohole, left eye: Secondary | ICD-10-CM

## 2020-05-07 ENCOUNTER — Encounter (HOSPITAL_BASED_OUTPATIENT_CLINIC_OR_DEPARTMENT_OTHER): Payer: Medicare Other | Admitting: Registered"

## 2020-05-07 ENCOUNTER — Encounter: Payer: Self-pay | Admitting: Registered"

## 2020-05-07 DIAGNOSIS — Z713 Dietary counseling and surveillance: Secondary | ICD-10-CM | POA: Diagnosis not present

## 2020-05-07 DIAGNOSIS — R7303 Prediabetes: Secondary | ICD-10-CM

## 2020-05-07 NOTE — Progress Notes (Signed)
Patient was seen on 05/07/20 for the Core Session 12 of Diabetes Prevention Program course at [facility]. By the end of this session patients are able to complete the following objectives:   Virtual Visit via Video Note  I connected with Benjamen Koelling on 05/07/20 at 11:30 AM EDT by a video enabled application and verified that I am speaking with the correct person using two identifiers.  Location: Patient: Home.  Provider: Office.   Learning Objectives:  Describe their current progress toward defined goals.  Describe common causes for slipping from healthy eating or being  active.  Explain what to do to get back on their feet after a slip.  Goals:   Record weight taken outside of class.   Track foods and beverages eaten each day in the "Food and Activity Tracker," including calories and fat grams for each item.    Track activity type, minutes active, and distance reached each day in the "Food and Activity Tracker."   Try out the two action plans created during session- "Slips from Healthy Eating: Action Plan" and "Slips from Being Active: Action Plan"  Answer questions on the handout.   Follow-Up Plan:  Attend Core Session 13 next week.   Bring completed "Food and Activity Tracker" next week to be reviewed by Lifestyle Coach.

## 2020-05-13 DIAGNOSIS — Z23 Encounter for immunization: Secondary | ICD-10-CM | POA: Diagnosis not present

## 2020-05-14 ENCOUNTER — Encounter: Payer: Medicare Other | Admitting: Registered"

## 2020-05-17 DIAGNOSIS — R9721 Rising PSA following treatment for malignant neoplasm of prostate: Secondary | ICD-10-CM | POA: Diagnosis not present

## 2020-05-17 DIAGNOSIS — C61 Malignant neoplasm of prostate: Secondary | ICD-10-CM | POA: Diagnosis not present

## 2020-05-21 DIAGNOSIS — R935 Abnormal findings on diagnostic imaging of other abdominal regions, including retroperitoneum: Secondary | ICD-10-CM | POA: Diagnosis not present

## 2020-05-24 ENCOUNTER — Other Ambulatory Visit (HOSPITAL_COMMUNITY): Payer: Self-pay | Admitting: Gastroenterology

## 2020-05-24 DIAGNOSIS — Z8546 Personal history of malignant neoplasm of prostate: Secondary | ICD-10-CM

## 2020-05-24 DIAGNOSIS — R935 Abnormal findings on diagnostic imaging of other abdominal regions, including retroperitoneum: Secondary | ICD-10-CM

## 2020-05-26 DIAGNOSIS — R935 Abnormal findings on diagnostic imaging of other abdominal regions, including retroperitoneum: Secondary | ICD-10-CM | POA: Diagnosis not present

## 2020-05-28 ENCOUNTER — Encounter: Payer: Medicare Other | Attending: Family Medicine | Admitting: Dietician

## 2020-05-28 ENCOUNTER — Other Ambulatory Visit: Payer: Self-pay

## 2020-05-28 DIAGNOSIS — R7303 Prediabetes: Secondary | ICD-10-CM | POA: Diagnosis not present

## 2020-05-28 NOTE — Progress Notes (Signed)
On 05/28/2020 patient completed Core Session 14 of Diabetes Prevention Program course virtually with [facility]. By the end of this session patients are able to complete the following objectives:   Virtual Visit via Video Note  I connected with Jacarius Handel, 01/03/2021 on 05/28/20 at 11:30 AM EDT by a video enabled application and verified that I am speaking with the correct person using two identifiers.  Location: Patient: Virtual Provider: Office  Learning Objectives:  Give examples of problem social cues and helpful social cues.   Explain how to remove problem social cues and add helpful ones.   Describe ways of coping with vacations and social events such as parties, holidays, and visits from relatives and friends.   Create an action plan to change a problem social cue and add a helpful one.   Goals:   Record weight taken outside of class.   Track foods and beverages eaten each day in the "Food and Activity Tracker," including calories and fat grams for each item.    Track activity type, minutes you were active, and distance you reached each day in the "Food and Activity Tracker."   Do your best to reach activity goal for the week.  Use action plan created during session to change a problem social cue and add a helpful social cue.   Answer questions regarding success of changing social cues on "To Do Next Week" handout.   Follow-Up Plan:  Attend Core Session 15 next week.   Email completed "Food and Activity Tracker" before next week to be reviewed by Lifestyle Coach.

## 2020-06-02 ENCOUNTER — Ambulatory Visit (HOSPITAL_COMMUNITY)
Admission: RE | Admit: 2020-06-02 | Discharge: 2020-06-02 | Disposition: A | Discharge status: Home / Self Care | Payer: Medicare Other | Source: Ambulatory Visit | Attending: Gastroenterology | Admitting: Gastroenterology

## 2020-06-02 ENCOUNTER — Other Ambulatory Visit: Payer: Self-pay

## 2020-06-02 DIAGNOSIS — I700 Atherosclerosis of aorta: Secondary | ICD-10-CM | POA: Diagnosis not present

## 2020-06-02 DIAGNOSIS — K869 Disease of pancreas, unspecified: Secondary | ICD-10-CM | POA: Diagnosis not present

## 2020-06-02 DIAGNOSIS — D3A8 Other benign neuroendocrine tumors: Secondary | ICD-10-CM | POA: Diagnosis not present

## 2020-06-02 DIAGNOSIS — Z8546 Personal history of malignant neoplasm of prostate: Secondary | ICD-10-CM | POA: Diagnosis not present

## 2020-06-02 DIAGNOSIS — R935 Abnormal findings on diagnostic imaging of other abdominal regions, including retroperitoneum: Secondary | ICD-10-CM | POA: Insufficient documentation

## 2020-06-02 MED ORDER — GALLIUM GA 68 DOTATATE IV KIT
5.2700 | PACK | Freq: Once | INTRAVENOUS | Status: AC | PRN
  Administered 2020-06-02: 5.27 via INTRAVENOUS

## 2020-06-03 NOTE — Progress Notes (Addendum)
[facility] Note  06/08/2020     CHIEF COMPLAINT Patient presents for Retina Follow Up   HISTORY OF PRESENT ILLNESS: Jesse Goodwin is a 75 y.o. male who presents to the clinic today for:  HPI    Retina Follow Up    Patient presents with  Other.  In left eye.  Duration of 5 weeks.  Since onset it is stable.  I, the attending physician,  performed the HPI with the patient and updated documentation appropriately.          Comments    5 week follow up ERM OS- Vision appears stable OU. Using Systane prn OU, Prolensa and Prednisolone QID OS.        Last edited by Bernarda Caffey, MD on 06/08/2020  3:23 PM. (History)     Referring physician: Jola Schmidt, MD [place],  [place] [address]  HISTORICAL INFORMATION:   Selected notes from the MEDICAL RECORD NUMBER Referred by Dr. Valetta Close for eval of VMT OS   CURRENT MEDICATIONS: Current Outpatient Medications (Ophthalmic Drugs)  Medication Sig  . Bromfenac Sodium (PROLENSA) 0.07 % SOLN Place 1 drop into the left eye 4 (four) times daily.  . dorzolamide-timolol (COSOPT) 22.3-6.8 MG/ML ophthalmic solution Place 1 drop into the left eye 2 (two) times daily.  . prednisoLONE acetate (PRED FORTE) 1 % ophthalmic suspension Place 1 drop into the left eye 4 (four) times daily.  . Tetrahydrozoline HCl (VISINE OP) Place 2 drops into both eyes daily as needed (dry eye). (Patient not taking: Reported on 06/08/2020)   No current facility-administered medications for this visit. (Ophthalmic Drugs)   Current Outpatient Medications (Other)  Medication Sig  . amLODipine (NORVASC) 10 MG tablet Take 10 mg by mouth daily.  Marland Kitchen aspirin 325 MG EC tablet Take 325 mg by mouth daily.  Marland Kitchen azelastine (ASTELIN) 0.1 % nasal spray   . losartan (COZAAR) 100 MG tablet   . simvastatin (ZOCOR) 20 MG tablet Take 20 mg by mouth at bedtime.  . Triamcinolone Acetonide (NASACORT ALLERGY 24HR NA) Place into the nose daily. 1 spray each  nostril  . Triamcinolone Acetonide (NASACORT AQ NA) 1 spray in each nostril   No current facility-administered medications for this visit. (Other)      REVIEW OF SYSTEMS: ROS    Positive for: Neurological, Genitourinary, Cardiovascular, Eyes   Negative for: Constitutional, Gastrointestinal, Skin, Musculoskeletal, HENT, Endocrine, Respiratory, Psychiatric, Allergic/Imm, Heme/Lymph   Last edited by Leonie Douglas, COA on 06/08/2020  2:48 PM. (History)       ALLERGIES No Known Allergies  PAST MEDICAL HISTORY Past Medical History:  Diagnosis Date  . Hypercholesteremia   . Hypertension   . Pre-diabetes    pre-dm   . Prostate cancer ([place])   . 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 CAROTID ENDARTERECTOMY     2014 FOR TREATMENT OF STROKE   . CATARACT EXTRACTION    . CHOLECYSTECTOMY     living in [place] at this time, does not remember when or who did it   . PROSTATE BIOPSY  2019  . RADIOACTIVE SEED IMPLANT N/A 04/30/2017   Procedure: RADIOACTIVE SEED IMPLANT/BRACHYTHERAPY IMPLANT;  Surgeon: Cleon Gustin, MD;  Location: [facility];  Service: Urology;  Laterality: N/A;  . SPACE OAR INSTILLATION N/A 04/30/2017   Procedure: SPACE OAR INSTILLATION;  Surgeon: Cleon Gustin,  MD;  Location: [place];  Service: Urology;  Laterality: N/A;    FAMILY HISTORY Family History  Problem Relation Age of Onset  . Stroke Mother   . Breast cancer Mother   . Dementia Mother   . CAD Mother   . Diabetes Mellitus II Mother   . Cancer Mother        breast  . Diabetes Mother   . Heart disease Mother   . Hyperlipidemia Mother   . Hypertension Mother   . Hyperlipidemia Brother   . Hyperlipidemia Son   . Stroke Sister     SOCIAL HISTORY Social History   Tobacco Use  . Smoking status: Never Smoker  . Smokeless tobacco: Never Used  Vaping Use  . Vaping Use: Never used   Substance Use Topics  . Alcohol use: Yes    Alcohol/week: 14.0 standard drinks    Types: 7 Glasses of wine, 7 Shots of liquor per week    Comment: drinks mixed drinks and wine in the night.  . Drug use: No         OPHTHALMIC EXAM:  Base Eye Exam    Visual Acuity (Snellen - Linear)      Right Left   Dist cc 20/20 20/40 -2   Dist ph cc  20/30 -2   Correction: Glasses       Tonometry (Tonopen, 2:53 PM)      Right Left   Pressure 17 26       Tonometry #2 (Applanation, 2:56 PM)      Right Left   Pressure  29       Pupils      Dark Light Shape React APD   Right 3 2 Round Brisk None   Left 4 3 Round Brisk None       Visual Fields (Counting fingers)      Left Right    Full Full       Extraocular Movement      Right Left    Full, Ortho Full, Ortho       Neuro/Psych    Oriented x3: Yes   Mood/Affect: Normal       Dilation    Both eyes: 1.0% Mydriacyl, 2.5% Phenylephrine @ 2:56 PM        Slit Lamp and Fundus Exam    Slit Lamp Exam      Right Left   Lids/Lashes Dermatochalasis - upper lid, mild MGD Dermatochalasis - upper lid, mild MGD   Conjunctiva/Sclera Trace Injection Trace Injection   Cornea arcus, well healed temporal cataract wounds, 1-2+ Punctate epithelial erosions, mild tear film debris arcus, well healed temporal cataract wounds, trace PEE   Anterior Chamber Deep and quiet deep and clear, no cell or flare   Iris Round and dilated Round and dilated   Lens PC IOL in good position PC IOL in good position   Vitreous clear Mild Asteroid hyalosis, Vitreous syneresis       Fundus Exam      Right Left   Disc Pink and Sharp Pink and Sharp   C/D Ratio 0.6 0.5   Macula Flat, Blunted foveal reflex, mild RPE mottling, No heme or edema Flat, hazy view, Blunted foveal reflex, small lamellar hole, cystic changes centrally -- slightly improved, no heme   Vessels Normal mild attenuation, mild tortuousity   Periphery Attached, reticular degeneration,  peripheral drusen, RPE atrophy nasal periphery Attached, reticular degeneration, peripheral drusen, hypopigmented CR scar at 0900  Refraction    Wearing Rx      Sphere Cylinder Axis Add   Right -0.75   +2.50   Left [place] +1.00 140 2.50          IMAGING AND PROCEDURES  Imaging and Procedures for 06/08/2020  OCT, Retina - OU - Both Eyes       Right Eye Quality was good. Central Foveal Thickness: 269. Progression has been stable. Findings include normal foveal contour, no IRF, no SRF, vitreomacular adhesion .   Left Eye Quality was good. Central Foveal Thickness: 356. Progression has improved. Findings include abnormal foveal contour, intraretinal fluid, lamellar hole, no SRF, epiretinal membrane, macular pucker (Mild interval improvement in central cystic changes).   Notes *Images captured and stored on drive  Diagnosis / Impression:  OD: NFP, no IRF/SRF OS: Mild interval improvement in central cystic changes  Clinical management:  See below  Abbreviations: NFP - Normal foveal profile. CME - cystoid macular edema. PED - pigment epithelial detachment. IRF - intraretinal fluid. SRF - subretinal fluid. EZ - ellipsoid zone. ERM - epiretinal membrane. ORA - outer retinal atrophy. ORT - outer retinal tubulation. SRHM - subretinal hyper-reflective material. IRHM - intraretinal hyper-reflective material        Fluorescein Angiography Optos (Transit OS)       Right Eye   Progression has no prior data. Early phase findings include window defect, staining. Mid/Late phase findings include window defect, staining (No leakage).   Left Eye   Progression has no prior data. Early phase findings include blockage, staining, window defect. Mid/Late phase findings include blockage, staining, window defect (No leakage, CME or hyperfluorescence of disc).   Notes **Images stored on drive**  Impression: mild staining, window defect; no leakage OU OS: No leakage, CME or  hyperfluorescence of disc                    ASSESSMENT/PLAN:    ICD-10-CM   1. Epiretinal membrane (ERM) of left eye  H35.372   2. Retinal edema  H35.81 OCT, Retina - OU - Both Eyes  3. 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 Essential hypertension  I10   7. Hypertensive retinopathy of both eyes  H35.033 Fluorescein Angiography Optos (Transit OS)  8. Pseudophakia of both eyes  Z96.1     1-4. Epiretinal membrane with lamellar hole and CME OS - mild ERM OS w/ central lamellar hole and surrounding cystic changes -- ?post op CME component - BCVA 20/30- improved from 20/40 - OCT shows interval improvement in cystic changes on PF/prolensa, but IOP elevated to 26,29 OS -- steroid response - FA 5.24.22 shows no leakage or CME - discussed findings, prognosis, and treatment options - no indication for ERM surgery at this time - differential diagnosis for central cystic changes includes foveoschisis - continue PF and Prolensa QID OS to treat any post op CME component -- decrease both to TID - add Cosopt BID OS for ocular hypertension - f/u 3-4 weeks -- IOP check/DFE/OCT  5. Asteroid hyalosis OS  - discussed diagnosis, prognosis and benign nature  - monitor  6,7. Hypertensive retinopathy OU - discussed importance of tight BP control - monitor  8. Pseudophakia OU  - s/p CE/IOL OU (Dr. Valetta Close)  - IOLs in good position - monitor  Ophthalmic Meds Ordered this visit:  Meds ordered this encounter  Medications  .  dorzolamide-timolol (COSOPT) 22.3-6.8 MG/ML ophthalmic solution    Sig: Place 1 drop into the left eye 2 (two) times daily.    Dispense:  10 mL    Refill:  2      Return for f/u 3-4 weeks IOP check OS, DFE, OCT.  There are no Patient Instructions on file for this visit.  This document serves as a record of services personally performed by Gardiner Sleeper, MD, PhD. It was  created on their behalf by Estill Bakes, COT an ophthalmic technician. The creation of this record is the provider's dictation and/or activities during the visit.    Electronically signed by: Estill Bakes, COT 5.19.22 @ 4:04 PM   This document serves as a record of services personally performed by Gardiner Sleeper, MD, PhD. It was created on their behalf by San Jetty. Owens Shark, OA an ophthalmic technician. The creation of this record is the provider's dictation and/or activities during the visit.    Electronically signed by: San Jetty. [place], [place] 05.24.2022 4:04 PM  Gardiner Sleeper, M.D., Ph.D. Diseases & Surgery of the Retina and Vitreous [facility]  I have reviewed the above documentation for accuracy and completeness, and I agree with the above. Gardiner Sleeper, M.D., Ph.D. 06/08/20 4:04 PM  Abbreviations: M myopia (nearsighted); A astigmatism; H hyperopia (farsighted); P presbyopia; Mrx spectacle prescription;  CTL contact lenses; OD right eye; OS left eye; OU both eyes  XT exotropia; ET esotropia; PEK punctate epithelial keratitis; PEE punctate epithelial erosions; DES dry eye syndrome; MGD meibomian gland dysfunction; ATs artificial tears; PFAT's preservative free artificial tears; [place] nuclear sclerotic cataract; PSC posterior subcapsular cataract; ERM epi-retinal membrane; PVD posterior vitreous detachment; RD retinal detachment; DM diabetes mellitus; DR diabetic retinopathy; NPDR non-proliferative diabetic retinopathy; PDR proliferative diabetic retinopathy; CSME clinically significant macular edema; DME diabetic macular edema; dbh dot blot hemorrhages; CWS cotton wool spot; POAG primary open angle glaucoma; C/D cup-to-disc ratio; HVF humphrey visual field; GVF goldmann visual field; OCT optical coherence tomography; IOP intraocular pressure; BRVO Branch retinal vein occlusion; CRVO central retinal vein occlusion; CRAO central retinal artery occlusion; BRAO branch retinal  artery occlusion; RT retinal tear; SB scleral buckle; PPV pars plana vitrectomy; VH Vitreous hemorrhage; PRP panretinal laser photocoagulation; IVK intravitreal kenalog; VMT vitreomacular traction; MH Macular hole;  NVD neovascularization of the disc; NVE neovascularization elsewhere; AREDS age related eye disease study; ARMD age related macular degeneration; POAG primary open angle glaucoma; EBMD epithelial/anterior basement membrane dystrophy; ACIOL anterior chamber intraocular lens; IOL intraocular lens; PCIOL posterior chamber intraocular lens; Phaco/IOL phacoemulsification with intraocular lens placement; [place] photorefractive keratectomy; LASIK laser assisted in situ keratomileusis; HTN hypertension; DM diabetes mellitus; COPD chronic obstructive pulmonary disease

## 2020-06-04 ENCOUNTER — Encounter (HOSPITAL_BASED_OUTPATIENT_CLINIC_OR_DEPARTMENT_OTHER): Payer: Medicare Other | Admitting: Registered"

## 2020-06-04 DIAGNOSIS — Z713 Dietary counseling and surveillance: Secondary | ICD-10-CM

## 2020-06-04 DIAGNOSIS — R7303 Prediabetes: Secondary | ICD-10-CM

## 2020-06-08 ENCOUNTER — Ambulatory Visit (INDEPENDENT_AMBULATORY_CARE_PROVIDER_SITE_OTHER): Payer: Medicare Other | Admitting: Ophthalmology

## 2020-06-08 ENCOUNTER — Encounter (INDEPENDENT_AMBULATORY_CARE_PROVIDER_SITE_OTHER): Payer: Self-pay | Admitting: Ophthalmology

## 2020-06-08 ENCOUNTER — Other Ambulatory Visit: Payer: Self-pay

## 2020-06-08 DIAGNOSIS — H35372 Puckering of macula, left eye: Secondary | ICD-10-CM

## 2020-06-08 DIAGNOSIS — H35342 Macular cyst, hole, or pseudohole, left eye: Secondary | ICD-10-CM

## 2020-06-08 DIAGNOSIS — Z961 Presence of intraocular lens: Secondary | ICD-10-CM

## 2020-06-08 DIAGNOSIS — H35352 Cystoid macular degeneration, left eye: Secondary | ICD-10-CM | POA: Diagnosis not present

## 2020-06-08 DIAGNOSIS — H4322 Crystalline deposits in vitreous body, left eye: Secondary | ICD-10-CM | POA: Diagnosis not present

## 2020-06-08 DIAGNOSIS — I10 Essential (primary) hypertension: Secondary | ICD-10-CM

## 2020-06-08 DIAGNOSIS — H35033 Hypertensive retinopathy, bilateral: Secondary | ICD-10-CM | POA: Diagnosis not present

## 2020-06-08 DIAGNOSIS — H3581 Retinal edema: Secondary | ICD-10-CM | POA: Diagnosis not present

## 2020-06-08 MED ORDER — DORZOLAMIDE HCL-TIMOLOL MAL 2-0.5 % OP SOLN: 1.0000 [drp] | Freq: Two times a day (BID) | OPHTHALMIC | 2 refills | Status: DC

## 2020-06-09 ENCOUNTER — Encounter: Payer: Self-pay | Admitting: Registered"

## 2020-06-09 NOTE — Progress Notes (Signed)
On 06/04/20 patient completed Core Session 15 of Diabetes Prevention Program course virtually with [facility]. By the end of this session patients are able to complete the following objectives:   Virtual Visit via Video Note  I connected with Jesse Goodwin on 06/04/20 at  by a video enabled application and verified that I am speaking with the correct person using two identifiers.  Location: Patient: Home.  Provider: Office.   Learning Objectives:  Explain how to prevent stress or cope with unavoidable stress.   Describe how this program can be a source of stress.   Explain how to manage stressful situations.   Create and follow an action plan for either preventing or coping with a stressful situation.   Goals:   Record weight taken outside of class.   Track foods and beverages eaten each day in the "Food and Activity Tracker," including calories and fat grams for each item.    Track activity type, minutes you were active, and distance you reached each day in the "Food and Activity Tracker."   Do your best to reach activity goal for the week.  Follow your action plan to reduce stress.   Answer questions on handout regarding success of action plan.   Follow-Up Plan:  Attend Core Session 16 next week.   Email completed "Food and Activity Tracker" before next week to be reviewed by Lifestyle Coach.

## 2020-06-11 ENCOUNTER — Encounter (HOSPITAL_BASED_OUTPATIENT_CLINIC_OR_DEPARTMENT_OTHER): Payer: Medicare Other | Admitting: Registered"

## 2020-06-11 ENCOUNTER — Encounter: Payer: Self-pay | Admitting: Registered"

## 2020-06-11 DIAGNOSIS — Z713 Dietary counseling and surveillance: Secondary | ICD-10-CM | POA: Diagnosis not present

## 2020-06-11 DIAGNOSIS — R7303 Prediabetes: Secondary | ICD-10-CM | POA: Diagnosis not present

## 2020-06-11 NOTE — Progress Notes (Signed)
On 06/11/20 patient completed Core Session 16 of Diabetes Prevention Program course virtually with [facility]. By the end of this session patients are able to complete the following objectives:   Virtual Visit via Video Note  I connected with Jesse Goodwin on 06/11/20 at 11:30 AM EDT by a video enabled application and verified that I am speaking with the correct person using two identifiers.  Location: Patient: Home.  Provider: Office.   Learning Objectives:  Measure their progress toward weight and physical activity goals since Session 1.   Develop a plan for improving progress, if their goals have not yet been attained.   Describe ways to stay motivated long-term.   Goals:   Record weight taken outside of class.   Track foods and beverages eaten each day in the "Food and Activity Tracker," including calories and fat grams for each item.    Track activity type, minutes you were active, and distance you reached each day in the "Food and Activity Tracker."   Utilize action plan to help stay motivated and complete questions on "To Do List."   Follow-Up Plan:  Attend session 17 in two weeks.   Email completed "Food and Activity Tracker" before next session to be reviewed by Lifestyle Coach.

## 2020-06-28 DIAGNOSIS — R5383 Other fatigue: Secondary | ICD-10-CM | POA: Diagnosis not present

## 2020-06-28 DIAGNOSIS — R059 Cough, unspecified: Secondary | ICD-10-CM | POA: Diagnosis not present

## 2020-06-28 DIAGNOSIS — U071 COVID-19: Secondary | ICD-10-CM | POA: Diagnosis not present

## 2020-06-28 DIAGNOSIS — R0981 Nasal congestion: Secondary | ICD-10-CM | POA: Diagnosis not present

## 2020-06-28 DIAGNOSIS — J069 Acute upper respiratory infection, unspecified: Secondary | ICD-10-CM | POA: Diagnosis not present

## 2020-07-05 DIAGNOSIS — U071 COVID-19: Secondary | ICD-10-CM | POA: Diagnosis not present

## 2020-07-05 DIAGNOSIS — Z20822 Contact with and (suspected) exposure to covid-19: Secondary | ICD-10-CM | POA: Diagnosis not present

## 2020-07-06 ENCOUNTER — Encounter (INDEPENDENT_AMBULATORY_CARE_PROVIDER_SITE_OTHER): Payer: Medicare Other | Admitting: Ophthalmology

## 2020-07-09 ENCOUNTER — Encounter: Payer: Medicare Other | Admitting: Registered"

## 2020-07-09 NOTE — Progress Notes (Signed)
[facility] Note  07/12/2020     CHIEF COMPLAINT Patient presents for Retina Follow Up   HISTORY OF PRESENT ILLNESS: Jesse Goodwin is a 75 y.o. male who presents to the clinic today for:  HPI     Retina Follow Up   Patient presents with  Other.  In left eye.  This started months ago.  Severity is moderate.  Duration of 4.5 weeks.  Since onset it is stable.  I, the attending physician,  performed the HPI with the patient and updated documentation appropriately.        Comments   75 y/o male pt here for 4.5 wk f/u for ERM w/lamellar hole and CME OS.  No change in [place] OU.  Denies pain, FOL, floaters.  PF and Prolensa TID OS.  Cosopt BID OS.      Last edited by Bernarda Caffey, MD on 07/14/2020  9:34 PM.    Pt is using PF and Prolensa TID OS, Cosopt BID OS  Referring physician: Cari Caraway, MD [place],  [place] [id number]  HISTORICAL INFORMATION:   Selected notes from the MEDICAL RECORD NUMBER Referred by Dr. Valetta Close for eval of VMT OS   CURRENT MEDICATIONS: Current Outpatient Medications (Ophthalmic Drugs)  Medication Sig   Bromfenac Sodium (PROLENSA) 0.07 % SOLN Place 1 drop into the left eye 4 (four) times daily.   dorzolamide-timolol (COSOPT) 22.3-6.8 MG/ML ophthalmic solution Place 1 drop into the left eye 2 (two) times daily.   prednisoLONE acetate (PRED FORTE) 1 % ophthalmic suspension Place 1 drop into the left eye 4 (four) times daily.   Tetrahydrozoline HCl (VISINE OP) Place 2 drops into both eyes daily as needed (dry eye).   No current facility-administered medications for this visit. (Ophthalmic Drugs)   Current Outpatient Medications (Other)  Medication Sig   amLODipine (NORVASC) 10 MG tablet Take 10 mg by mouth daily.   aspirin 325 MG EC tablet Take 325 mg by mouth daily.   azelastine (ASTELIN) 0.1 % nasal spray    losartan (COZAAR) 100 MG tablet    Nirmatrelvir & Ritonavir (PAXLOVID) 20 x 150 MG & 10 x 100MG  TBPK  See admin instructions.   simvastatin (ZOCOR) 20 MG tablet Take 20 mg by mouth at bedtime.   Triamcinolone Acetonide (NASACORT ALLERGY 24HR NA) Place into the nose daily. 1 spray each nostril   Triamcinolone Acetonide (NASACORT AQ NA) 1 spray in each nostril (Patient not taking: Reported on 07/12/2020)   No current facility-administered medications for this visit. (Other)      REVIEW OF SYSTEMS: ROS   Positive for: Eyes Negative for: Constitutional, Gastrointestinal, Neurological, Skin, Genitourinary, Musculoskeletal, HENT, Endocrine, Cardiovascular, Respiratory, Psychiatric, Allergic/Imm, Heme/Lymph Last edited by Matthew Folks, COA on 07/12/2020  1:36 PM.        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    Past Surgical History:  Procedure Laterality Date   CAROTID ENDARTERECTOMY     2014 FOR TREATMENT OF STROKE    CATARACT EXTRACTION     CHOLECYSTECTOMY     living in [place] at this time, does not remember when or who did it    EYE SURGERY  PROSTATE BIOPSY  2019   RADIOACTIVE SEED IMPLANT N/A 04/30/2017   Procedure: RADIOACTIVE SEED IMPLANT/BRACHYTHERAPY IMPLANT;  Surgeon: Cleon Gustin, MD;  Location: [facility];  Service: Urology;  Laterality: N/A;   SPACE OAR INSTILLATION N/A 04/30/2017   Procedure: SPACE OAR INSTILLATION;  Surgeon: Cleon Gustin, MD;  Location: [facility];  Service: Urology;  Laterality: N/A;    FAMILY HISTORY Family History  Problem Relation Age of Onset   Stroke Mother    Breast cancer Mother    Dementia Mother    CAD Mother    Diabetes Mellitus II Mother    Cancer Mother        breast   Diabetes Mother    Heart disease Mother    Hyperlipidemia Mother    Hypertension  Mother    Hyperlipidemia Brother    Hyperlipidemia Son    Stroke Sister     SOCIAL HISTORY Social History   Tobacco Use   Smoking status: Never   Smokeless tobacco: Never  Vaping Use   Vaping Use: Never used  Substance Use Topics   Alcohol use: Yes    Alcohol/week: 14.0 standard drinks    Types: 7 Glasses of wine, 7 Shots of liquor per week    Comment: drinks mixed drinks and wine in the night.   Drug use: No         OPHTHALMIC EXAM:  Base Eye Exam     Visual Acuity (Snellen - Linear)       Right Left   Dist cc 20/20 20/40   Dist ph cc  NI    Correction: Glasses         Tonometry (Tonopen, 1:38 PM)       Right Left   Pressure 10 12         Pupils       Dark Light Shape React APD   Right 3 2 Round Brisk None   Left 3 2 Round Brisk None         Visual Fields (Counting fingers)       Left Right    Full Full         Extraocular Movement       Right Left    Full, Ortho Full, Ortho         Neuro/Psych     Oriented x3: Yes   Mood/Affect: Normal         Dilation     Both eyes: 1.0% Mydriacyl, 2.5% Phenylephrine @ 1:38 PM           Slit Lamp and Fundus Exam     Slit Lamp Exam       Right Left   Lids/Lashes Dermatochalasis - upper lid, mild MGD Dermatochalasis - upper lid, mild MGD   Conjunctiva/Sclera Trace Injection Trace Injection   Cornea arcus, well healed temporal cataract wounds, 1-2+ Punctate epithelial erosions, mild tear film debris arcus, well healed temporal cataract wounds, trace PEE   Anterior Chamber Deep and quiet deep and clear, no cell or flare   Iris Round and dilated Round and dilated   Lens PC IOL in good position PC IOL in good position   Vitreous clear Mild Asteroid hyalosis, Vitreous syneresis         Fundus Exam       Right Left   Disc Pink and Sharp Pink and Sharp   C/D Ratio 0.6 0.5   Macula Flat, Blunted foveal reflex, mild RPE  mottling, No heme or edema Flat, hazy view, Blunted foveal  reflex, small lamellar hole, cystic changes centrally -- slightly improved, no heme   Vessels Normal mild attenuation, mild tortuousity   Periphery Attached, reticular degeneration, peripheral drusen, RPE atrophy nasal periphery Attached, reticular degeneration, peripheral drusen, hypopigmented CR scar at 0900            IMAGING AND PROCEDURES  Imaging and Procedures for 07/12/2020  OCT, Retina - OU - Both Eyes       Right Eye Quality was good. Central Foveal Thickness: 269. Progression has been stable. Findings include normal foveal contour, no IRF, no SRF, vitreomacular adhesion .   Left Eye Quality was good. Central Foveal Thickness: 352. Progression has improved. Findings include abnormal foveal contour, intraretinal fluid, lamellar hole, no SRF, epiretinal membrane, macular pucker (Mild interval improvement in central cystic changes).   Notes *Images captured and stored on drive  Diagnosis / Impression:  OD: NFP, no IRF/SRF OS: Mild interval improvement in central cystic changes  Clinical management:  See below  Abbreviations: NFP - Normal foveal profile. CME - cystoid macular edema. PED - pigment epithelial detachment. IRF - intraretinal fluid. SRF - subretinal fluid. EZ - ellipsoid zone. ERM - epiretinal membrane. ORA - outer retinal atrophy. ORT - outer retinal tubulation. SRHM - subretinal hyper-reflective material. IRHM - intraretinal hyper-reflective material            ASSESSMENT/PLAN:    ICD-10-CM   1. Epiretinal membrane (ERM) of left eye  H35.372     2. Retinal edema  H35.81 OCT, Retina - OU - Both Eyes    3. 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 Essential hypertension  I10     7. Hypertensive retinopathy of both eyes  H35.033     8. Pseudophakia of both eyes  Z96.1        1-4. 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 Asteroid hyalosis OS  - discussed diagnosis, prognosis and benign nature  - monitor  6,7. Hypertensive retinopathy OU - discussed importance of tight BP control - monitor  8. Pseudophakia OU  - s/p CE/IOL OU (Dr. Valetta Close)  - IOLs in good position - monitor  Ophthalmic Meds Ordered this visit:  No orders of the defined types were placed in this encounter.     Return in about 4 weeks (around 08/09/2020) for f/u CME OU, DFE, OCT.  There are no Patient Instructions on file for this visit.  This document serves as a record of services personally performed by Gardiner Sleeper, MD, PhD. It was created on their behalf by Roselee Nova, COMT. The creation of this record is the provider's dictation and/or activities during the visit.  Electronically signed by: Roselee Nova,  COMT 07/14/20 9:38 PM  This document serves as a record of services personally performed by Gardiner Sleeper, MD, PhD. It was created on their behalf by San Jetty. Owens Shark, OA an ophthalmic technician. The creation of this record is the provider's dictation and/or activities during the visit.    Electronically signed by: San Jetty. Marguerita Merles 06.27.2022 9:38 PM   Gardiner Sleeper, M.D., Ph.D. Diseases & Surgery of the Retina and Vitreous [facility]  I have reviewed the above documentation for accuracy and completeness, and I agree with the above. Gardiner Sleeper, M.D., Ph.D. 07/14/20 9:38 PM  Abbreviations: M myopia (nearsighted); A astigmatism; H hyperopia (farsighted); P presbyopia; Mrx spectacle prescription;  CTL contact lenses; OD right eye; OS left eye; OU both eyes  XT exotropia; ET esotropia; PEK punctate epithelial keratitis; PEE punctate epithelial erosions; DES dry eye syndrome; MGD meibomian gland dysfunction; ATs artificial tears; PFAT's preservative free artificial tears; [place] nuclear sclerotic cataract; PSC posterior subcapsular cataract; ERM epi-retinal membrane; PVD posterior vitreous detachment; RD retinal detachment; DM diabetes mellitus; DR diabetic retinopathy; NPDR non-proliferative diabetic retinopathy; PDR proliferative diabetic retinopathy; CSME clinically significant macular edema; DME diabetic macular edema; dbh dot blot hemorrhages; CWS cotton wool spot; POAG primary open angle glaucoma; C/D cup-to-disc ratio; HVF humphrey visual field; GVF goldmann visual field; OCT optical coherence tomography; IOP intraocular pressure; BRVO Branch retinal vein occlusion; CRVO central retinal vein occlusion; CRAO central retinal artery occlusion; BRAO branch retinal artery occlusion; RT retinal tear; SB scleral buckle; PPV pars plana vitrectomy; VH Vitreous hemorrhage; PRP panretinal laser photocoagulation; IVK intravitreal kenalog; VMT vitreomacular traction; MH Macular hole;  NVD neovascularization of the disc; NVE neovascularization elsewhere; AREDS age related eye disease study; ARMD age related macular degeneration; POAG primary open angle glaucoma; EBMD epithelial/anterior basement membrane dystrophy; ACIOL anterior chamber intraocular lens; IOL intraocular lens; PCIOL posterior chamber intraocular lens; Phaco/IOL phacoemulsification with intraocular lens placement; [place] photorefractive keratectomy; LASIK laser assisted in situ keratomileusis; HTN hypertension; DM diabetes mellitus; COPD chronic obstructive pulmonary disease

## 2020-07-12 ENCOUNTER — Other Ambulatory Visit: Payer: Self-pay

## 2020-07-12 ENCOUNTER — Ambulatory Visit (INDEPENDENT_AMBULATORY_CARE_PROVIDER_SITE_OTHER): Payer: Medicare Other | Admitting: Ophthalmology

## 2020-07-12 ENCOUNTER — Encounter (INDEPENDENT_AMBULATORY_CARE_PROVIDER_SITE_OTHER): Payer: Self-pay | Admitting: Ophthalmology

## 2020-07-12 DIAGNOSIS — Z961 Presence of intraocular lens: Secondary | ICD-10-CM | POA: Diagnosis not present

## 2020-07-12 DIAGNOSIS — H4322 Crystalline deposits in vitreous body, left eye: Secondary | ICD-10-CM | POA: Diagnosis not present

## 2020-07-12 DIAGNOSIS — H35033 Hypertensive retinopathy, bilateral: Secondary | ICD-10-CM

## 2020-07-12 DIAGNOSIS — H3581 Retinal edema: Secondary | ICD-10-CM | POA: Diagnosis not present

## 2020-07-12 DIAGNOSIS — H35352 Cystoid macular degeneration, left eye: Secondary | ICD-10-CM

## 2020-07-12 DIAGNOSIS — H35372 Puckering of macula, left eye: Secondary | ICD-10-CM

## 2020-07-12 DIAGNOSIS — I10 Essential (primary) hypertension: Secondary | ICD-10-CM

## 2020-07-12 DIAGNOSIS — H35342 Macular cyst, hole, or pseudohole, left eye: Secondary | ICD-10-CM

## 2020-07-14 ENCOUNTER — Encounter (INDEPENDENT_AMBULATORY_CARE_PROVIDER_SITE_OTHER): Payer: Self-pay | Admitting: Ophthalmology

## 2020-07-23 ENCOUNTER — Encounter: Payer: Medicare Other | Admitting: Registered"

## 2020-07-27 DIAGNOSIS — C7A8 Other malignant neuroendocrine tumors: Secondary | ICD-10-CM | POA: Diagnosis not present

## 2020-07-27 DIAGNOSIS — D3A8 Other benign neuroendocrine tumors: Secondary | ICD-10-CM | POA: Diagnosis not present

## 2020-08-02 DIAGNOSIS — C61 Malignant neoplasm of prostate: Secondary | ICD-10-CM | POA: Diagnosis not present

## 2020-08-02 DIAGNOSIS — R7301 Impaired fasting glucose: Secondary | ICD-10-CM | POA: Diagnosis not present

## 2020-08-02 DIAGNOSIS — J309 Allergic rhinitis, unspecified: Secondary | ICD-10-CM | POA: Diagnosis not present

## 2020-08-02 DIAGNOSIS — R935 Abnormal findings on diagnostic imaging of other abdominal regions, including retroperitoneum: Secondary | ICD-10-CM | POA: Diagnosis not present

## 2020-08-02 DIAGNOSIS — N400 Enlarged prostate without lower urinary tract symptoms: Secondary | ICD-10-CM | POA: Diagnosis not present

## 2020-08-02 DIAGNOSIS — I6523 Occlusion and stenosis of bilateral carotid arteries: Secondary | ICD-10-CM | POA: Diagnosis not present

## 2020-08-02 DIAGNOSIS — Z8673 Personal history of transient ischemic attack (TIA), and cerebral infarction without residual deficits: Secondary | ICD-10-CM | POA: Diagnosis not present

## 2020-08-02 DIAGNOSIS — E7800 Pure hypercholesterolemia, unspecified: Secondary | ICD-10-CM | POA: Diagnosis not present

## 2020-08-02 DIAGNOSIS — J069 Acute upper respiratory infection, unspecified: Secondary | ICD-10-CM | POA: Diagnosis not present

## 2020-08-02 DIAGNOSIS — I10 Essential (primary) hypertension: Secondary | ICD-10-CM | POA: Diagnosis not present

## 2020-08-02 DIAGNOSIS — N529 Male erectile dysfunction, unspecified: Secondary | ICD-10-CM | POA: Diagnosis not present

## 2020-08-02 DIAGNOSIS — R7309 Other abnormal glucose: Secondary | ICD-10-CM | POA: Diagnosis not present

## 2020-08-05 NOTE — Progress Notes (Signed)
[facility] Note  08/09/2020     CHIEF COMPLAINT Patient presents for Retina Follow Up   HISTORY OF PRESENT ILLNESS: Jesse Goodwin is a 75 y.o. male who presents to the clinic today for:  HPI     Retina Follow Up   Patient presents with  Other.  In left eye.  Duration of 4 weeks.  Since onset it is stable.  I, the attending physician,  performed the HPI with the patient and updated documentation appropriately.        Comments   4 week follow up ERM OS- Doing well.  No changes in vision since last visit. Using Prednisolone TID, Prolensa TID, and Cosopt BID OS.      Last edited by Bernarda Caffey, MD on 08/09/2020  2:01 PM.    Pt is using PF and Prolensa TID OS, Cosopt BID OS  Referring physician: Cari Caraway, MD [place],  [place] [address]  HISTORICAL INFORMATION:   Selected notes from the MEDICAL RECORD NUMBER Referred by Dr. Valetta Close for eval of VMT OS   CURRENT MEDICATIONS: Current Outpatient Medications (Ophthalmic Drugs)  Medication Sig   Bromfenac Sodium (PROLENSA) 0.07 % SOLN Place 1 drop into the left eye 4 (four) times daily.   dorzolamide-timolol (COSOPT) 22.3-6.8 MG/ML ophthalmic solution Place 1 drop into the left eye 2 (two) times daily.   prednisoLONE acetate (PRED FORTE) 1 % ophthalmic suspension Place 1 drop into the left eye 4 (four) times daily.   Tetrahydrozoline HCl (VISINE OP) Place 2 drops into both eyes daily as needed (dry eye).   No current facility-administered medications for this visit. (Ophthalmic Drugs)   Current Outpatient Medications (Other)  Medication Sig   amLODipine (NORVASC) 10 MG tablet Take 10 mg by mouth daily.   aspirin 325 MG EC tablet Take 325 mg by mouth daily.   azelastine (ASTELIN) 0.1 % nasal spray    losartan (COZAAR) 100 MG tablet    Nirmatrelvir & Ritonavir (PAXLOVID) 20 x 150 MG & 10 x '100MG'$  TBPK See admin instructions.   simvastatin (ZOCOR) 20 MG tablet Take 20 mg by  mouth at bedtime.   Triamcinolone Acetonide (NASACORT ALLERGY 24HR NA) Place into the nose daily. 1 spray each nostril   Triamcinolone Acetonide (NASACORT AQ NA)    No current facility-administered medications for this visit. (Other)      REVIEW OF SYSTEMS: ROS   Positive for: Neurological, Genitourinary, Eyes Negative for: Constitutional, Gastrointestinal, Skin, Musculoskeletal, HENT, Endocrine, Cardiovascular, Respiratory, Psychiatric, Allergic/Imm, Heme/Lymph Last edited by Leonie Douglas, COA on 08/09/2020  1:36 PM.         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    Past Surgical History:  Procedure Laterality Date   CAROTID ENDARTERECTOMY     2014 FOR TREATMENT OF STROKE    CATARACT EXTRACTION     CHOLECYSTECTOMY     living in [place] at this time, does not remember when or who did it    EYE SURGERY     PROSTATE BIOPSY  2019   RADIOACTIVE SEED IMPLANT N/A 04/30/2017   Procedure: RADIOACTIVE SEED IMPLANT/BRACHYTHERAPY IMPLANT;  Surgeon: Cleon Gustin, MD;  Location: [facility];  Service: Urology;  Laterality: N/A;   SPACE OAR INSTILLATION N/A 04/30/2017   Procedure: SPACE OAR INSTILLATION;  Surgeon: Cleon Gustin, MD;  Location: [facility];  Service: Urology;  Laterality: N/A;    FAMILY HISTORY Family History  Problem Relation Age of Onset   Stroke Mother    Breast cancer Mother    Dementia Mother    CAD Mother    Diabetes Mellitus II Mother    Cancer Mother        breast   Diabetes Mother    Heart disease Mother    Hyperlipidemia Mother    Hypertension Mother    Hyperlipidemia Brother    Hyperlipidemia Son    Stroke Sister     SOCIAL HISTORY Social History   Tobacco Use   Smoking  status: Never   Smokeless tobacco: Never  Vaping Use   Vaping Use: Never used  Substance Use Topics   Alcohol use: Yes    Alcohol/week: 14.0 standard drinks    Types: 7 Glasses of wine, 7 Shots of liquor per week    Comment: drinks mixed drinks and wine in the night.   Drug use: No         OPHTHALMIC EXAM:  Base Eye Exam     Visual Acuity (Snellen - Linear)       Right Left   Dist cc 20/20 20/40 +2   Dist ph cc  NI         Tonometry (Tonopen, 1:41 PM)       Right Left   Pressure 14 11         Pupils       Dark Light Shape React APD   Right 3 2 Round Brisk None   Left 4 3 Round Brisk None         Visual Fields (Counting fingers)       Left Right    Full Full         Extraocular Movement       Right Left    Full Full         Neuro/Psych     Oriented x3: Yes   Mood/Affect: Normal         Dilation     Both eyes: 1.0% Mydriacyl, 2.5% Phenylephrine @ 1:41 PM           Slit Lamp and Fundus Exam     Slit Lamp Exam       Right Left   Lids/Lashes Dermatochalasis - upper lid, mild MGD, RUL nasal papilloma Dermatochalasis - upper lid, mild MGD   Conjunctiva/Sclera Trace Injection Trace Injection   Cornea arcus, well healed temporal cataract wounds, 1+ Punctate epithelial erosions, mild tear film debris arcus, well healed temporal cataract wounds, 1+ PEE, tear film debris   Anterior Chamber Deep and quiet deep and clear, no cell or flare   Iris Round and dilated Round and dilated   Lens PC IOL in good position PC IOL in good position   Vitreous clear Mild Asteroid hyalosis, Vitreous syneresis         Fundus Exam       Right Left   Disc Pink and Sharp,+cupping Pink and Sharp   C/D Ratio 0.6 0.5   Macula Flat, Blunted foveal reflex, mild RPE mottling, No heme or edema Flat, hazy view, Blunted foveal reflex, small lamellar hole, cystic changes centrally -- slightly improved, no heme   Vessels Vascular attenuation mild  attenuation,  mild tortuousity   Periphery Attached, reticular degeneration, peripheral drusen, RPE atrophy nasal periphery Attached, reticular degeneration, peripheral drusen, hypopigmented CR scar at 0900           Refraction     Wearing Rx       Sphere Cylinder Axis Add   Right -0.75 Sphere  +2.50   Left [place] +1.00 140 2.50            IMAGING AND PROCEDURES  Imaging and Procedures for 08/09/2020  OCT, Retina - OU - Both Eyes       Right Eye Quality was good. Central Foveal Thickness: 266. Progression has been stable. Findings include normal foveal contour, no IRF, no SRF, vitreomacular adhesion .   Left Eye Quality was good. Central Foveal Thickness: 345. Progression has improved. Findings include abnormal foveal contour, intraretinal fluid, lamellar hole, no SRF, epiretinal membrane, macular pucker (Mild interval improvement in IRF).   Notes *Images captured and stored on drive  Diagnosis / Impression:  OD: NFP, no IRF/SRF OS: ERM w/ lamellar hole; Mild interval improvement in cystic changes/IRF  Clinical management:  See below  Abbreviations: NFP - Normal foveal profile. CME - cystoid macular edema. PED - pigment epithelial detachment. IRF - intraretinal fluid. SRF - subretinal fluid. EZ - ellipsoid zone. ERM - epiretinal membrane. ORA - outer retinal atrophy. ORT - outer retinal tubulation. SRHM - subretinal hyper-reflective material. IRHM - intraretinal hyper-reflective material             ASSESSMENT/PLAN:    ICD-10-CM   1. Epiretinal membrane (ERM) of left eye  H35.372     2. Retinal edema  H35.81 OCT, Retina - OU - Both Eyes    3. 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 Essential hypertension  I10     7. Hypertensive retinopathy of both eyes  H35.033     8. Pseudophakia of both eyes  Z96.1         1-4. 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 Asteroid hyalosis OS  - discussed diagnosis, prognosis and benign nature  - monitor  6,7. Hypertensive retinopathy OU - discussed importance of tight BP control - monitor  8. Pseudophakia OU  - s/p CE/IOL OU (Dr. Valetta Close)  - IOLs in good position - monitor  Ophthalmic Meds Ordered this visit:  No orders of the defined types were placed in this encounter.     Return in about 6 weeks (around 09/20/2020) for 6 weeks ERM with lamellar hole/CME OS.  There are no Patient Instructions on file for this visit.  This document serves as a record of services personally performed by Gardiner Sleeper, MD, PhD. It was created on their  behalf by Roselee Nova, COMT. The creation of this record is the provider's dictation and/or activities during the visit.  Electronically signed by: Roselee Nova, COMT 08/10/20 9:04 AM  Gardiner Sleeper, M.D., Ph.D. Diseases & Surgery of the Retina and Vitreous [facility]  I have reviewed the above documentation for accuracy and completeness, and I agree with the above. Gardiner Sleeper, M.D., Ph.D. 08/10/20 9:04 AM  Abbreviations: M myopia (nearsighted); A astigmatism; H hyperopia (farsighted); P presbyopia; Mrx spectacle prescription;  CTL contact lenses; OD right eye; OS left eye; OU both eyes  XT exotropia; ET esotropia; PEK punctate epithelial  keratitis; PEE punctate epithelial erosions; DES dry eye syndrome; MGD meibomian gland dysfunction; ATs artificial tears; PFAT's preservative free artificial tears; [place] nuclear sclerotic cataract; PSC posterior subcapsular cataract; ERM epi-retinal membrane; PVD posterior vitreous detachment; RD retinal detachment; DM diabetes mellitus; DR diabetic retinopathy; NPDR non-proliferative diabetic retinopathy; PDR proliferative diabetic retinopathy; CSME clinically significant macular edema; DME diabetic macular edema; dbh dot blot hemorrhages; CWS cotton wool spot; POAG primary open angle glaucoma; C/D cup-to-disc ratio; HVF humphrey visual field; GVF goldmann visual field; OCT optical coherence tomography; IOP intraocular pressure; BRVO Branch retinal vein occlusion; CRVO central retinal vein occlusion; CRAO central retinal artery occlusion; BRAO branch retinal artery occlusion; RT retinal tear; SB scleral buckle; PPV pars plana vitrectomy; VH Vitreous hemorrhage; PRP panretinal laser photocoagulation; IVK intravitreal kenalog; VMT vitreomacular traction; MH Macular hole;  NVD neovascularization of the disc; NVE neovascularization elsewhere; AREDS age related eye disease study; ARMD age related macular degeneration; POAG primary open angle glaucoma; EBMD epithelial/anterior basement membrane dystrophy; ACIOL anterior chamber intraocular lens; IOL intraocular lens; PCIOL posterior chamber intraocular lens; Phaco/IOL phacoemulsification with intraocular lens placement; [place] photorefractive keratectomy; LASIK laser assisted in situ keratomileusis; HTN hypertension; DM diabetes mellitus; COPD chronic obstructive pulmonary disease

## 2020-08-06 ENCOUNTER — Encounter: Payer: Medicare Other | Admitting: Registered"

## 2020-08-06 DIAGNOSIS — C7A8 Other malignant neuroendocrine tumors: Secondary | ICD-10-CM | POA: Diagnosis not present

## 2020-08-06 DIAGNOSIS — C259 Malignant neoplasm of pancreas, unspecified: Secondary | ICD-10-CM | POA: Diagnosis not present

## 2020-08-06 DIAGNOSIS — K3189 Other diseases of stomach and duodenum: Secondary | ICD-10-CM | POA: Diagnosis not present

## 2020-08-09 ENCOUNTER — Other Ambulatory Visit: Payer: Self-pay

## 2020-08-09 ENCOUNTER — Ambulatory Visit (INDEPENDENT_AMBULATORY_CARE_PROVIDER_SITE_OTHER): Payer: Medicare Other | Admitting: Ophthalmology

## 2020-08-09 ENCOUNTER — Encounter (INDEPENDENT_AMBULATORY_CARE_PROVIDER_SITE_OTHER): Payer: Self-pay | Admitting: Ophthalmology

## 2020-08-09 DIAGNOSIS — H35352 Cystoid macular degeneration, left eye: Secondary | ICD-10-CM

## 2020-08-09 DIAGNOSIS — H35342 Macular cyst, hole, or pseudohole, left eye: Secondary | ICD-10-CM | POA: Diagnosis not present

## 2020-08-09 DIAGNOSIS — H4322 Crystalline deposits in vitreous body, left eye: Secondary | ICD-10-CM

## 2020-08-09 DIAGNOSIS — I10 Essential (primary) hypertension: Secondary | ICD-10-CM

## 2020-08-09 DIAGNOSIS — H35033 Hypertensive retinopathy, bilateral: Secondary | ICD-10-CM | POA: Diagnosis not present

## 2020-08-09 DIAGNOSIS — H35372 Puckering of macula, left eye: Secondary | ICD-10-CM | POA: Diagnosis not present

## 2020-08-09 DIAGNOSIS — Z961 Presence of intraocular lens: Secondary | ICD-10-CM | POA: Diagnosis not present

## 2020-08-09 DIAGNOSIS — H3581 Retinal edema: Secondary | ICD-10-CM

## 2020-08-10 DIAGNOSIS — N529 Male erectile dysfunction, unspecified: Secondary | ICD-10-CM | POA: Diagnosis not present

## 2020-08-10 DIAGNOSIS — I6523 Occlusion and stenosis of bilateral carotid arteries: Secondary | ICD-10-CM | POA: Diagnosis not present

## 2020-08-10 DIAGNOSIS — Z8673 Personal history of transient ischemic attack (TIA), and cerebral infarction without residual deficits: Secondary | ICD-10-CM | POA: Diagnosis not present

## 2020-08-10 DIAGNOSIS — M79604 Pain in right leg: Secondary | ICD-10-CM | POA: Diagnosis not present

## 2020-08-10 DIAGNOSIS — J309 Allergic rhinitis, unspecified: Secondary | ICD-10-CM | POA: Diagnosis not present

## 2020-08-10 DIAGNOSIS — I6529 Occlusion and stenosis of unspecified carotid artery: Secondary | ICD-10-CM | POA: Diagnosis not present

## 2020-08-10 DIAGNOSIS — E7800 Pure hypercholesterolemia, unspecified: Secondary | ICD-10-CM | POA: Diagnosis not present

## 2020-08-10 DIAGNOSIS — I10 Essential (primary) hypertension: Secondary | ICD-10-CM | POA: Diagnosis not present

## 2020-08-10 DIAGNOSIS — R7301 Impaired fasting glucose: Secondary | ICD-10-CM | POA: Diagnosis not present

## 2020-08-10 DIAGNOSIS — C61 Malignant neoplasm of prostate: Secondary | ICD-10-CM | POA: Diagnosis not present

## 2020-08-10 DIAGNOSIS — N400 Enlarged prostate without lower urinary tract symptoms: Secondary | ICD-10-CM | POA: Diagnosis not present

## 2020-08-10 DIAGNOSIS — C7A8 Other malignant neuroendocrine tumors: Secondary | ICD-10-CM | POA: Diagnosis not present

## 2020-08-27 ENCOUNTER — Other Ambulatory Visit (INDEPENDENT_AMBULATORY_CARE_PROVIDER_SITE_OTHER): Payer: Self-pay | Admitting: Ophthalmology

## 2020-08-30 DIAGNOSIS — R059 Cough, unspecified: Secondary | ICD-10-CM | POA: Diagnosis not present

## 2020-08-30 DIAGNOSIS — R040 Epistaxis: Secondary | ICD-10-CM | POA: Diagnosis not present

## 2020-08-30 DIAGNOSIS — J301 Allergic rhinitis due to pollen: Secondary | ICD-10-CM | POA: Diagnosis not present

## 2020-08-30 DIAGNOSIS — J3089 Other allergic rhinitis: Secondary | ICD-10-CM | POA: Diagnosis not present

## 2020-09-06 ENCOUNTER — Other Ambulatory Visit: Payer: Self-pay

## 2020-09-06 DIAGNOSIS — I6529 Occlusion and stenosis of unspecified carotid artery: Secondary | ICD-10-CM

## 2020-09-07 DIAGNOSIS — D3709 Neoplasm of uncertain behavior of other specified sites of the oral cavity: Secondary | ICD-10-CM | POA: Diagnosis not present

## 2020-09-07 DIAGNOSIS — K1329 Other disturbances of oral epithelium, including tongue: Secondary | ICD-10-CM | POA: Diagnosis not present

## 2020-09-10 ENCOUNTER — Ambulatory Visit (HOSPITAL_BASED_OUTPATIENT_CLINIC_OR_DEPARTMENT_OTHER): Payer: Medicare Other | Admitting: Registered"

## 2020-09-10 DIAGNOSIS — R7303 Prediabetes: Secondary | ICD-10-CM

## 2020-09-16 ENCOUNTER — Encounter: Payer: Self-pay | Admitting: Registered"

## 2020-09-16 NOTE — Progress Notes (Signed)
On 09/10/20 patient completed a post core session of the Diabetes Prevention Program course virtually with [facility]. By the end of this session patients are able to complete the following objectives:   I connected with Froylan Marolt by a video enabled application and verified that I am speaking with the correct person using two identifiers.  Location: Patient: Home.  Provider: Office.   Learning Objectives: Describe the difference between a lapse and a relapse. List steps to prevent a lapse from becoming a relapse.  Identify situations that increase risk of having a lapse.  Make a plan to help prevent lapses and recover after a lapse has occurred.   Goals:  Record weight taken outside of class.  Track foods and beverages eaten each day in the "Food and Activity Tracker," including calories and fat grams for each item.   Track activity type, minutes you were active, and distance you reached each day in the "Food and Activity Tracker."   Follow-Up Plan: Attend next session.  Email completed "Food and Activity Trackers" before next session to be reviewed by Lifestyle Coach.

## 2020-09-17 ENCOUNTER — Other Ambulatory Visit: Payer: Self-pay

## 2020-09-17 ENCOUNTER — Ambulatory Visit (HOSPITAL_COMMUNITY)
Admission: RE | Admit: 2020-09-17 | Discharge: 2020-09-17 | Disposition: A | Discharge status: Home / Self Care | Payer: Medicare Other | Source: Ambulatory Visit | Attending: Vascular Surgery | Admitting: Vascular Surgery

## 2020-09-17 DIAGNOSIS — I6529 Occlusion and stenosis of unspecified carotid artery: Secondary | ICD-10-CM

## 2020-09-21 ENCOUNTER — Encounter (INDEPENDENT_AMBULATORY_CARE_PROVIDER_SITE_OTHER): Payer: Medicare Other | Admitting: Ophthalmology

## 2020-09-21 NOTE — Progress Notes (Signed)
[facility]  09/24/2020     CHIEF COMPLAINT Patient presents for Retina Follow Up   HISTORY OF PRESENT ILLNESS: Jesse Goodwin is a 75 y.o. male who presents to the clinic today for:  HPI     Retina Follow Up   Patient presents with  Other.  In left eye.  This started weeks ago.  Severity is moderate.  Duration of weeks.  Since onset it is stable.  I, the attending physician,  performed the HPI with the patient and updated documentation appropriately.        Comments   Pt states vision is the same OU.  Pt denies eye pain or discomfort and denies any new or worsening floaters or fol OU.      Last edited by Bernarda Caffey, MD on 09/25/2020 12:04 AM.     Pt is using PF and Prolensa TID OS, Cosopt BID OS, he states a couple months ago he could read his ipad without his glasses, but now he is unable to do that without moving the ipad further away from his face  Referring physician: Jola Schmidt, MD [place],  [place] [address]  HISTORICAL INFORMATION:   Selected notes from the MEDICAL RECORD NUMBER Referred by Dr. Valetta Close for eval of VMT OS   CURRENT MEDICATIONS: Current Outpatient Medications (Ophthalmic Drugs)  Medication Sig   Bromfenac Sodium (PROLENSA) 0.07 % SOLN Place 1 drop into the left eye 2 (two) times daily.   dorzolamide-timolol (COSOPT) 22.3-6.8 MG/ML ophthalmic solution Place 1 drop into the left eye 2 (two) times daily.   prednisoLONE acetate (PRED FORTE) 1 % ophthalmic suspension Place 1 drop into the left eye 2 (two) times daily.   Tetrahydrozoline HCl (VISINE OP) Place 2 drops into both eyes daily as needed (dry eye).   No current facility-administered medications for this visit. (Ophthalmic Drugs)   Current Outpatient Medications (Other)  Medication Sig   amLODipine (NORVASC) 10 MG tablet Take 10 mg by mouth daily.   aspirin 325 MG EC tablet Take 325 mg by mouth daily.   azelastine (ASTELIN) 0.1 % nasal spray     losartan (COZAAR) 100 MG tablet    Nirmatrelvir & Ritonavir (PAXLOVID) 20 x 150 MG & 10 x '100MG'$  TBPK See admin instructions.   simvastatin (ZOCOR) 20 MG tablet Take 20 mg by mouth at bedtime.   Triamcinolone Acetonide (NASACORT ALLERGY 24HR NA) Place into the nose daily. 1 spray each nostril   Triamcinolone Acetonide (NASACORT AQ NA)    No current facility-administered medications for this visit. (Other)    REVIEW OF SYSTEMS: ROS   Positive for: Neurological, Genitourinary, Eyes Negative for: Constitutional, Gastrointestinal, Skin, Musculoskeletal, HENT, Endocrine, Cardiovascular, Respiratory, Psychiatric, Allergic/Imm, Heme/Lymph Last edited by Doneen Poisson on 09/24/2020  2:04 PM.      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    Past Surgical History:  Procedure Laterality Date   CAROTID ENDARTERECTOMY     2014 FOR TREATMENT OF STROKE    CATARACT EXTRACTION     CHOLECYSTECTOMY     living in [place] at this time, does not remember when or who did it    EYE  SURGERY     PROSTATE BIOPSY  2019   RADIOACTIVE SEED IMPLANT N/A 04/30/2017   Procedure: RADIOACTIVE SEED IMPLANT/BRACHYTHERAPY IMPLANT;  Surgeon: Cleon Gustin, MD;  Location: [facility];  Service: Urology;  Laterality: N/A;   SPACE OAR INSTILLATION N/A 04/30/2017   Procedure: SPACE OAR INSTILLATION;  Surgeon: Cleon Gustin, MD;  Location: [facility];  Service: Urology;  Laterality: N/A;    FAMILY HISTORY Family History  Problem Relation Age of Onset   Stroke Mother    Breast cancer Mother    Dementia Mother    CAD Mother    Diabetes Mellitus II Mother    Cancer Mother        breast   Diabetes Mother    Heart disease Mother    Hyperlipidemia  Mother    Hypertension Mother    Hyperlipidemia Brother    Hyperlipidemia Son    Stroke Sister     SOCIAL HISTORY Social History   Tobacco Use   Smoking status: Never   Smokeless tobacco: Never  Vaping Use   Vaping Use: Never used  Substance Use Topics   Alcohol use: Yes    Alcohol/week: 14.0 standard drinks    Types: 7 Glasses of wine, 7 Shots of liquor per week    Comment: drinks mixed drinks and wine in the night.   Drug use: No         OPHTHALMIC EXAM:  Base Eye Exam     Visual Acuity (Snellen - Linear)       Right Left   Dist cc 20/20 -1 20/40 +1   Dist ph cc  20/40 +2    Correction: Glasses         Tonometry (Tonopen, 2:06 PM)       Right Left   Pressure 13 15         Pupils       Dark Light Shape React APD   Right 4 3 Round Brisk 0   Left 5 4 Round Brisk 0         Visual Fields       Left Right    Full Full         Extraocular Movement       Right Left    Full Full         Neuro/Psych     Oriented x3: Yes   Mood/Affect: Normal         Dilation     Both eyes: 1.0% Mydriacyl, 2.5% Phenylephrine @ 2:06 PM           Slit Lamp and Fundus Exam     Slit Lamp Exam       Right Left   Lids/Lashes Dermatochalasis - upper lid, mild MGD, RUL nasal papilloma Dermatochalasis - upper lid, mild MGD   Conjunctiva/Sclera Trace Injection Trace Injection   Cornea arcus, well healed temporal cataract wounds, 1+ Punctate epithelial erosions, mild tear film debris arcus, well healed temporal cataract wounds, 1+ PEE, tear film debris   Anterior Chamber Deep and quiet deep and clear, no cell or flare   Iris Round and dilated Round and dilated   Lens PC IOL in good position PC IOL in good position   Vitreous clear Mild Asteroid hyalosis, Vitreous syneresis         Fundus Exam       Right Left   Disc Pink and Sharp,+cupping Pink and Sharp   C/D Ratio 0.6  0.5   Macula Flat, Blunted foveal reflex, mild RPE mottling, No heme or  edema Flat, hazy view, Blunted foveal reflex, small lamellar hole, cystic changes centrally -- slightly improved, no heme   Vessels Vascular attenuation, Tortuous mild attenuation, mild tortuousity   Periphery Attached, reticular degeneration, peripheral drusen, RPE atrophy nasal periphery Attached, reticular degeneration, peripheral drusen, hypopigmented CR scar at 0900, No heme            Refraction     Wearing Rx       Sphere Cylinder Axis Add   Right -0.75 Sphere  +2.50   Left [place] +1.00 140 2.50            IMAGING AND PROCEDURES  Imaging and Procedures for 09/24/2020  OCT, Retina - OU - Both Eyes       Right Eye Quality was good. Central Foveal Thickness: 270. Progression has been stable. Findings include normal foveal contour, no IRF, no SRF, vitreomacular adhesion .   Left Eye Quality was good. Central Foveal Thickness: 335. Progression has improved. Findings include abnormal foveal contour, intraretinal fluid, lamellar hole, no SRF, epiretinal membrane, macular pucker (Mild improvement in cystic changes).   Notes *Images captured and stored on drive  Diagnosis / Impression:  OD: NFP, no IRF/SRF OS: ERM w/ lamellar hole; Mild improvement in cystic changes  Clinical management:  See below  Abbreviations: NFP - Normal foveal profile. CME - cystoid macular edema. PED - pigment epithelial detachment. IRF - intraretinal fluid. SRF - subretinal fluid. EZ - ellipsoid zone. ERM - epiretinal membrane. ORA - outer retinal atrophy. ORT - outer retinal tubulation. SRHM - subretinal hyper-reflective material. IRHM - intraretinal hyper-reflective material              ASSESSMENT/PLAN:    ICD-10-CM   1. Epiretinal membrane (ERM) of left eye  H35.372     2. Retinal edema  H35.81 OCT, Retina - OU - Both Eyes    3. 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 Essential  hypertension  I10     7. Hypertensive retinopathy of both eyes  H35.033     8. Pseudophakia of both eyes  Z96.1       1-4. Epiretinal membrane with lamellar hole and CME OS - mild ERM OS w/ central lamellar hole and surrounding cystic changes -- ?post op CME component - BCVA 20/40 -- stable - OCT shows mild interval improvement in cystic changes/IRF on PF/prolensa BID OS - FA 5.24.22 shows no leakage or CME - IOP 13 and 15 today on Cosopt BID -- reduce to Qdaily - discussed findings, prognosis, and treatment options - no indication for ERM surgery at this time - differential diagnosis for central cystic changes includes foveoschisis - continue PF and Prolensa BID -- treat post op CME component - f/u 2 months -- DFE/OCT  5. Asteroid hyalosis OS  - discussed diagnosis, prognosis and benign nature  - monitor  6,7. Hypertensive retinopathy OU - discussed importance of tight BP control - monitor  8. Pseudophakia OU  - s/p CE/IOL OU (Dr. Valetta Close)  - IOLs in good position - monitor  Ophthalmic Meds Ordered this visit:  Meds ordered this encounter  Medications   Bromfenac Sodium (PROLENSA) 0.07 % SOLN    Sig: Place 1 drop into the left eye 2 (two) times daily.  Dispense:  6 mL    Refill:  3   prednisoLONE acetate (PRED FORTE) 1 % ophthalmic suspension    Sig: Place 1 drop into the left eye 2 (two) times daily.    Dispense:  15 mL    Refill:  3       Return in about 2 months (around 11/24/2020) for f/u ERM OS, DFE, OCT.  There are no Patient Instructions on file for this visit.  This document serves as a record of services personally performed by Gardiner Sleeper, MD, PhD. It was created on their behalf by San Jetty. Owens Shark, OA an ophthalmic technician. The creation of this record is the provider's dictation and/or activities during the visit.    Electronically signed by: San Jetty. Marguerita Merles 09.06.2022 12:34 AM   Gardiner Sleeper, M.D., Ph.D. Diseases & Surgery of the  Retina and Vitreous [facility]  I have reviewed the above documentation for accuracy and completeness, and I agree with the above. Gardiner Sleeper, M.D., Ph.D. 09/25/20 12:34 AM   Abbreviations: M myopia (nearsighted); A astigmatism; H hyperopia (farsighted); P presbyopia; Mrx spectacle prescription;  CTL contact lenses; OD right eye; OS left eye; OU both eyes  XT exotropia; ET esotropia; PEK punctate epithelial keratitis; PEE punctate epithelial erosions; DES dry eye syndrome; MGD meibomian gland dysfunction; ATs artificial tears; PFAT's preservative free artificial tears; [place] nuclear sclerotic cataract; PSC posterior subcapsular cataract; ERM epi-retinal membrane; PVD posterior vitreous detachment; RD retinal detachment; DM diabetes mellitus; DR diabetic retinopathy; NPDR non-proliferative diabetic retinopathy; PDR proliferative diabetic retinopathy; CSME clinically significant macular edema; DME diabetic macular edema; dbh dot blot hemorrhages; CWS cotton wool spot; POAG primary open angle glaucoma; C/D cup-to-disc ratio; HVF humphrey visual field; GVF goldmann visual field; OCT optical coherence tomography; IOP intraocular pressure; BRVO Branch retinal vein occlusion; CRVO central retinal vein occlusion; CRAO central retinal artery occlusion; BRAO branch retinal artery occlusion; RT retinal tear; SB scleral buckle; PPV pars plana vitrectomy; VH Vitreous hemorrhage; PRP panretinal laser photocoagulation; IVK intravitreal kenalog; VMT vitreomacular traction; MH Macular hole;  NVD neovascularization of the disc; NVE neovascularization elsewhere; AREDS age related eye disease study; ARMD age related macular degeneration; POAG primary open angle glaucoma; EBMD epithelial/anterior basement membrane dystrophy; ACIOL anterior chamber intraocular lens; IOL intraocular lens; PCIOL posterior chamber intraocular lens; Phaco/IOL phacoemulsification with intraocular lens placement; [place]  photorefractive keratectomy; LASIK laser assisted in situ keratomileusis; HTN hypertension; DM diabetes mellitus; COPD chronic obstructive pulmonary disease

## 2020-09-24 ENCOUNTER — Ambulatory Visit (INDEPENDENT_AMBULATORY_CARE_PROVIDER_SITE_OTHER): Payer: Medicare Other | Admitting: Ophthalmology

## 2020-09-24 ENCOUNTER — Encounter: Payer: Medicare Other | Attending: Family Medicine | Admitting: Registered"

## 2020-09-24 ENCOUNTER — Encounter: Payer: Self-pay | Admitting: Registered"

## 2020-09-24 ENCOUNTER — Other Ambulatory Visit: Payer: Self-pay

## 2020-09-24 DIAGNOSIS — H35342 Macular cyst, hole, or pseudohole, left eye: Secondary | ICD-10-CM

## 2020-09-24 DIAGNOSIS — R7303 Prediabetes: Secondary | ICD-10-CM | POA: Insufficient documentation

## 2020-09-24 DIAGNOSIS — H4322 Crystalline deposits in vitreous body, left eye: Secondary | ICD-10-CM | POA: Diagnosis not present

## 2020-09-24 DIAGNOSIS — I10 Essential (primary) hypertension: Secondary | ICD-10-CM | POA: Diagnosis not present

## 2020-09-24 DIAGNOSIS — H35033 Hypertensive retinopathy, bilateral: Secondary | ICD-10-CM

## 2020-09-24 DIAGNOSIS — H35352 Cystoid macular degeneration, left eye: Secondary | ICD-10-CM | POA: Diagnosis not present

## 2020-09-24 DIAGNOSIS — Z961 Presence of intraocular lens: Secondary | ICD-10-CM | POA: Diagnosis not present

## 2020-09-24 DIAGNOSIS — H35372 Puckering of macula, left eye: Secondary | ICD-10-CM

## 2020-09-24 DIAGNOSIS — H3581 Retinal edema: Secondary | ICD-10-CM

## 2020-09-24 MED ORDER — PREDNISOLONE ACETATE 1 % OP SUSP: 1.0000 [drp] | Freq: Two times a day (BID) | OPHTHALMIC | 3 refills | Status: DC

## 2020-09-24 MED ORDER — PROLENSA 0.07 % OP SOLN: 1.0000 [drp] | Freq: Two times a day (BID) | OPHTHALMIC | 3 refills | Status: DC

## 2020-09-24 NOTE — Progress Notes (Signed)
On 09/24/20 patient completed the Diabetes Prevention Program course virtually with [facility]. By the end of this session patients are able to complete the following objectives:   Virtual Visit via Video Note  I connected with Jesse Goodwin by a video enabled application and verified that I am speaking with the correct person using two identifiers.  Location: Patient: Home.  Provider: Office.   Learning Objectives: Define fiber and describe the difference between insoluble and soluble fiber  List foods that are good sources of fiber Explain the health benefits of fiber  Describe ways to increase volume of meals and snacks while staying within fat goal.   Goals:  Record weight taken outside of class.  Track foods and beverages eaten each day in the "Food and Activity Tracker," including calories and fat grams for each item.   Track activity type, minutes you were active, and distance you reached each day in the "Food and Activity Tracker."   Follow-Up Plan: Attend next session.  Email completed "Food and Activity Trackers" before next session to be reviewed by Lifestyle Coach.

## 2020-09-25 ENCOUNTER — Encounter (INDEPENDENT_AMBULATORY_CARE_PROVIDER_SITE_OTHER): Payer: Self-pay | Admitting: Ophthalmology

## 2020-09-27 ENCOUNTER — Ambulatory Visit (INDEPENDENT_AMBULATORY_CARE_PROVIDER_SITE_OTHER): Payer: Medicare Other | Admitting: Surgery

## 2020-09-27 ENCOUNTER — Other Ambulatory Visit: Payer: Self-pay

## 2020-09-27 ENCOUNTER — Encounter: Payer: Self-pay | Admitting: Surgery

## 2020-09-27 VITALS — BP 126/74 | HR 59 | Temp 98.3°F | Resp 20 | Ht 69.0 in | Wt 201.0 lb

## 2020-09-27 DIAGNOSIS — I6523 Occlusion and stenosis of bilateral carotid arteries: Secondary | ICD-10-CM

## 2020-09-27 NOTE — Progress Notes (Signed)
[facility]ist [facility]  Patient name: Jesse Goodwin MRN: [id number] DOB: 1945/10/22 Sex: male     REASON FOR CONSULT:    Carotid follow up  HISTORY OF PRESENT ILLNESS:   Jesse Goodwin is a 75 y.o. male, who is back today for evaluation of his carotid disease.  The patient has history of right carotid endarterectomy performed by Dr. Terence Lux in 2004.  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  At that time, the patient was having dysarthria as well as left arm weakness.  He has not had any symptoms since his operation.  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  He denies any symptoms of stroke such as numbness or weakness in either extremity, amaurosis, or slurred speech.  The patient has a positive family history cardiovascular disease and diabetes.  He is a non-smoker.  He is on a statin for hypercholesterolemia.  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      FAMILY HISTORY   Family History  Problem Relation Age of Onset   Stroke Mother    Breast cancer Mother    Dementia Mother    CAD Mother    Diabetes Mellitus II Mother    Cancer Mother        breast   Diabetes Mother    Heart disease Mother    Hyperlipidemia Mother    Hypertension Mother    Hyperlipidemia Brother    Hyperlipidemia Son    Stroke Sister     SOCIAL HISTORY:   Social History   Socioeconomic History   Marital status: Married    Spouse name: Not on file   Number of children: 3   Years of education: Not on file   Highest education level: Not on file  Occupational History   Not on file  Tobacco Use   Smoking  status: Never   Smokeless tobacco: Never  Vaping Use   Vaping Use: Never used  Substance and Sexual Activity   Alcohol use: Yes    Alcohol/week: 14.0 standard drinks    Types: 7 Glasses of wine, 7 Shots of liquor per week    Comment: drinks mixed drinks and wine in the night.   Drug use: No   Sexual activity: Yes  Other Topics Concern   Not on file  Social History Narrative   Not on file   Social Determinants of Health   Financial Resource Strain: Not on file  Food Insecurity: Not on file  Transportation Needs: Not on file  Physical Activity: Not on file  Stress: Not on file  Social Connections: Not on file  Intimate Partner Violence: Not on file    ALLERGIES:    No Known Allergies  CURRENT MEDICATIONS:    Current Outpatient Medications  Medication Sig Dispense Refill   amLODipine (NORVASC) 10 MG tablet Take 10 mg by mouth daily.  0   aspirin 325 MG EC tablet Take 325 mg by mouth daily.     azelastine (ASTELIN) 0.1 % nasal spray   0   Bromfenac Sodium (PROLENSA) 0.07 % SOLN Place  1 drop into the left eye 2 (two) times daily. 6 mL 3   dorzolamide-timolol (COSOPT) 22.3-6.8 MG/ML ophthalmic solution Place 1 drop into the left eye 2 (two) times daily. 10 mL 2   losartan (COZAAR) 100 MG tablet   0   Nirmatrelvir & Ritonavir (PAXLOVID) 20 x 150 MG & 10 x '100MG'$  TBPK See admin instructions.     prednisoLONE acetate (PRED FORTE) 1 % ophthalmic suspension Place 1 drop into the left eye 2 (two) times daily. 15 mL 3   simvastatin (ZOCOR) 20 MG tablet Take 20 mg by mouth at bedtime.     Tetrahydrozoline HCl (VISINE OP) Place 2 drops into both eyes daily as needed (dry eye).     Triamcinolone Acetonide (NASACORT ALLERGY 24HR NA) Place into the nose daily. 1 spray each nostril     Triamcinolone Acetonide (NASACORT AQ NA)      No current facility-administered medications for this visit.    REVIEW OF SYSTEMS:   '[X]'$  denotes positive finding, '[ ]'$  denotes negative finding Cardiac   Comments:  Chest pain or chest pressure:    Shortness of breath upon exertion:    Short of breath when lying flat:    Irregular heart rhythm:        Vascular    Pain in calf, thigh, or hip brought on by ambulation:    Pain in feet at night that wakes you up from your sleep:     Blood clot in your veins:    Leg swelling:         Pulmonary    Oxygen at home:    Productive cough:     Wheezing:         Neurologic    Sudden weakness in arms or legs:     Sudden numbness in arms or legs:     Sudden onset of difficulty speaking or slurred speech:    Temporary loss of vision in one eye:     Problems with dizziness:         Gastrointestinal    Blood in stool:      Vomited blood:         Genitourinary    Burning when urinating:     Blood in urine:        Psychiatric    Major depression:         Hematologic    Bleeding problems:    Problems with blood clotting too easily:        Skin    Rashes or ulcers:        Constitutional    Fever or chills:     PHYSICAL EXAM:   Vitals:   09/27/20 1433 09/27/20 1434  BP: 122/70 126/74  Pulse: (!) 59   Resp: 20   Temp: 98.3 F (36.8 C)   SpO2: 95%   Weight: 201 lb (91.2 kg)   Height: '5\' 9"'$  (1.753 m)     GENERAL: The patient is a well-nourished male, in no acute distress. The vital signs are documented above. CARDIAC: There is a regular rate and rhythm.  VASCULAR: Palpable radial pulses bilaterally PULMONARY: Nonlabored respirations MUSCULOSKELETAL: There are no major deformities or cyanosis. NEUROLOGIC: No focal weakness or paresthesias are detected. SKIN: There are no ulcers or rashes noted. PSYCHIATRIC: The patient has a normal affect.  STUDIES:   I have reviewed his carotid duplex with the following findings:  Right Carotid: Velocities in the right ICA are consistent with a 1-39%  stenosis.   Left Carotid: Velocities in the left ICA are consistent with a 1-39%  stenosis.   Vertebrals:  Bilateral vertebral  arteries demonstrate antegrade flow.  Subclavians: Normal flow hemodynamics were seen in bilateral subclavian               arteries.  ASSESSMENT and PLAN   Carotid: No significant stenosis was identified in either carotid artery.  He remains asymptomatic.  Since it has been so long since his endarterectomy, I have elected to get surveillance imaging every other year.  He will return in 2 years for carotid duplex.   Leia Alf, MD, FACS [facility] [phone] Pager [phone]

## 2020-10-27 DIAGNOSIS — C7A8 Other malignant neuroendocrine tumors: Secondary | ICD-10-CM | POA: Diagnosis not present

## 2020-11-12 DIAGNOSIS — Z23 Encounter for immunization: Secondary | ICD-10-CM | POA: Diagnosis not present

## 2020-11-18 NOTE — Progress Notes (Shared)
[facility] Note  11/23/2020     CHIEF COMPLAINT Patient presents for No chief complaint on file.   HISTORY OF PRESENT ILLNESS: Jesse Goodwin is a 75 y.o. male who presents to the clinic today for:      Referring physician: Cari Caraway, MD [place],  [place] [address]  HISTORICAL INFORMATION:   Selected notes from the MEDICAL RECORD NUMBER Referred by Dr. Valetta Close for eval of VMT OS   CURRENT MEDICATIONS: Current Outpatient Medications (Ophthalmic Drugs)  Medication Sig   Bromfenac Sodium (PROLENSA) 0.07 % SOLN Place 1 drop into the left eye 2 (two) times daily.   dorzolamide-timolol (COSOPT) 22.3-6.8 MG/ML ophthalmic solution Place 1 drop into the left eye 2 (two) times daily.   prednisoLONE acetate (PRED FORTE) 1 % ophthalmic suspension Place 1 drop into the left eye 2 (two) times daily.   Tetrahydrozoline HCl (VISINE OP) Place 2 drops into both eyes daily as needed (dry eye).   No current facility-administered medications for this visit. (Ophthalmic Drugs)   Current Outpatient Medications (Other)  Medication Sig   amLODipine (NORVASC) 10 MG tablet Take 10 mg by mouth daily.   aspirin 325 MG EC tablet Take 325 mg by mouth daily.   azelastine (ASTELIN) 0.1 % nasal spray    losartan (COZAAR) 100 MG tablet    Nirmatrelvir & Ritonavir (PAXLOVID) 20 x 150 MG & 10 x 100MG  TBPK See admin instructions.   simvastatin (ZOCOR) 20 MG tablet Take 20 mg by mouth at bedtime.   Triamcinolone Acetonide (NASACORT ALLERGY 24HR NA) Place into the nose daily. 1 spray each nostril   Triamcinolone Acetonide (NASACORT AQ NA)    No current facility-administered medications for this visit. 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    Past Surgical History:  Procedure Laterality Date   CAROTID ENDARTERECTOMY     2014 FOR TREATMENT OF STROKE    CATARACT EXTRACTION     CHOLECYSTECTOMY     living in [place] at this time, does not remember when or who did it    EYE SURGERY     PROSTATE BIOPSY  2019   RADIOACTIVE SEED IMPLANT N/A 04/30/2017   Procedure: RADIOACTIVE SEED IMPLANT/BRACHYTHERAPY IMPLANT;  Surgeon: Cleon Gustin, MD;  Location: [facility];  Service: Urology;  Laterality: N/A;   SPACE OAR INSTILLATION N/A 04/30/2017   Procedure: SPACE OAR INSTILLATION;  Surgeon: Cleon Gustin, MD;  Location[facility];  Service: Urology;  Laterality: N/A;    FAMILY HISTORY Family History  Problem Relation Age of Onset   Stroke Mother    Breast cancer Mother    Dementia Mother    CAD Mother    Diabetes Mellitus II Mother    Cancer Mother        breast   Diabetes Mother    Heart disease Mother    Hyperlipidemia Mother    Hypertension Mother    Hyperlipidemia Brother    Hyperlipidemia Son    Stroke Sister     SOCIAL HISTORY Social History   Tobacco Use  Smoking status: Never   Smokeless tobacco: Never  Vaping Use   Vaping Use: Never used  Substance Use Topics   Alcohol use: Yes    Alcohol/week: 14.0 standard drinks    Types: 7 Glasses of wine, 7 Shots of liquor per week    Comment: drinks mixed drinks and wine in the night.   Drug use: No         OPHTHALMIC EXAM:  Not recorded     IMAGING AND PROCEDURES  Imaging and Procedures for 11/23/2020            ASSESSMENT/PLAN:  No diagnosis found.   1-4. 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 Asteroid hyalosis OS  - discussed diagnosis, prognosis and benign nature  - monitor  6,7. Hypertensive retinopathy OU - discussed importance of tight BP control - monitor  8. Pseudophakia OU  - s/p CE/IOL OU (Dr. Valetta Close)  - IOLs in good position - monitor  Ophthalmic Meds Ordered this visit:  No orders of the defined types were placed in this encounter.      No follow-ups on file.  There are no Patient Instructions on file for this visit.  This document serves as a record of services personally performed by Gardiner Sleeper, MD, PhD. It was created on their behalf by Estill Bakes, COT an ophthalmic technician. The creation of this record is the provider's dictation and/or activities during the visit.    Electronically signed by: Estill Bakes, COT 11.3.22 @ 10:18 AM    Gardiner Sleeper, M.D., Ph.D. Diseases & Surgery of the Retina and Vitreous [facility]: M myopia (nearsighted); A astigmatism; H hyperopia (farsighted); P presbyopia; Mrx spectacle prescription;  CTL contact lenses; OD right eye; OS left eye; OU both eyes  XT exotropia; ET esotropia; PEK punctate epithelial keratitis; PEE punctate epithelial erosions; DES dry eye syndrome; MGD meibomian gland dysfunction; ATs artificial tears; PFAT's preservative free artificial tears; [place] nuclear sclerotic cataract; PSC posterior subcapsular cataract; ERM epi-retinal membrane; PVD posterior vitreous detachment; RD retinal detachment; DM diabetes mellitus; DR diabetic retinopathy; NPDR non-proliferative diabetic retinopathy; PDR proliferative  diabetic retinopathy; CSME clinically significant macular edema; DME diabetic macular edema; dbh dot blot hemorrhages; CWS cotton wool spot; POAG primary open angle glaucoma; C/D cup-to-disc ratio; HVF humphrey visual field; GVF goldmann visual field; OCT optical coherence tomography; IOP intraocular pressure; BRVO Branch retinal vein occlusion; CRVO central retinal vein occlusion; CRAO central retinal artery occlusion; BRAO branch retinal artery occlusion; RT retinal tear; SB scleral buckle; PPV pars plana vitrectomy; VH Vitreous hemorrhage; PRP panretinal laser photocoagulation; IVK intravitreal kenalog; VMT vitreomacular traction; MH Macular hole;  NVD neovascularization of the disc; NVE neovascularization elsewhere; AREDS age related eye disease study; ARMD age related macular degeneration; POAG primary open angle glaucoma; EBMD epithelial/anterior basement membrane dystrophy; ACIOL anterior chamber intraocular lens; IOL intraocular lens; PCIOL posterior  chamber intraocular lens; Phaco/IOL phacoemulsification with intraocular lens placement; [place] photorefractive keratectomy; LASIK laser assisted in situ keratomileusis; HTN hypertension; DM diabetes mellitus; COPD chronic obstructive pulmonary disease

## 2020-11-22 DIAGNOSIS — C61 Malignant neoplasm of prostate: Secondary | ICD-10-CM | POA: Diagnosis not present

## 2020-11-22 NOTE — Progress Notes (Signed)
[facility] Note  11/26/2020     CHIEF COMPLAINT Patient presents for Retina Follow Up   HISTORY OF PRESENT ILLNESS: Jesse Goodwin is a 75 y.o. male who presents to the clinic today for:  HPI     Retina Follow Up   Patient presents with  Other.  In left eye.  Severity is moderate.  Duration of 9 weeks.  Since onset it is stable.  I, the attending physician,  performed the HPI with the patient and updated documentation appropriately.        Comments   9 week retina follow up. Patient states vision seems about the same. Left eye pressure up today. Gave patient Brimonidine and Cosopt per Estill Bamberg.      Last edited by Bernarda Caffey, MD on 11/26/2020  3:32 PM.    Pt is using PF and Prolensa BID OS as well as Cosopt Trena Platt OS  Referring physician: Jola Schmidt, MD [place],  [place] [id number]  HISTORICAL INFORMATION:   Selected notes from the MEDICAL RECORD NUMBER Referred by Dr. Valetta Close for eval of VMT OS   CURRENT MEDICATIONS: Current Outpatient Medications (Ophthalmic Drugs)  Medication Sig   Bromfenac Sodium (PROLENSA) 0.07 % SOLN Place 1 drop into the left eye 2 (two) times daily.   dorzolamide-timolol (COSOPT) 22.3-6.8 MG/ML ophthalmic solution Place 1 drop into the left eye 2 (two) times daily.   prednisoLONE acetate (PRED FORTE) 1 % ophthalmic suspension Place 1 drop into the left eye 2 (two) times daily.   Tetrahydrozoline HCl (VISINE OP) Place 2 drops into both eyes daily as needed (dry eye).   No current facility-administered medications for this visit. (Ophthalmic Drugs)   Current Outpatient Medications (Other)  Medication Sig   amLODipine (NORVASC) 10 MG tablet Take 10 mg by mouth daily.   aspirin 325 MG EC tablet Take 325 mg by mouth daily.   azelastine (ASTELIN) 0.1 % nasal spray    losartan (COZAAR) 100 MG tablet    Nirmatrelvir & Ritonavir (PAXLOVID) 20 x 150 MG & 10 x 100MG  TBPK See admin instructions.   simvastatin  (ZOCOR) 20 MG tablet Take 20 mg by mouth at bedtime.   Triamcinolone Acetonide (NASACORT ALLERGY 24HR NA) Place into the nose daily. 1 spray each nostril   Triamcinolone Acetonide (NASACORT AQ NA)    No current facility-administered medications for this visit. (Other)    REVIEW OF SYSTEMS: ROS   Positive for: Neurological, Genitourinary, Eyes Negative for: Constitutional, Gastrointestinal, Skin, Musculoskeletal, HENT, Endocrine, Cardiovascular, Respiratory, Psychiatric, Allergic/Imm, Heme/Lymph Last edited by Elmore Guise, COT on 11/26/2020  1:35 PM.       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    Past Surgical History:  Procedure Laterality Date   CAROTID ENDARTERECTOMY     2014 FOR TREATMENT OF STROKE    CATARACT EXTRACTION     CHOLECYSTECTOMY     living in [place] at this time, does not remember when or who did it    EYE SURGERY     PROSTATE BIOPSY  2019   RADIOACTIVE SEED IMPLANT N/A 04/30/2017   Procedure: RADIOACTIVE SEED IMPLANT/BRACHYTHERAPY IMPLANT;  Surgeon: Cleon Gustin, MD;  Location[facility];  Service: Urology;  Laterality: N/A;   SPACE OAR INSTILLATION N/A 04/30/2017   Procedure: SPACE OAR INSTILLATION;  Surgeon: Cleon Gustin, MD;  Location: [facility];  Service: Urology;  Laterality: N/A;    FAMILY HISTORY Family History  Problem Relation Age of Onset   Stroke Mother    Breast cancer Mother    Dementia Mother    CAD Mother    Diabetes Mellitus II Mother    Cancer Mother        breast   Diabetes Mother    Heart disease Mother    Hyperlipidemia Mother    Hypertension Mother    Hyperlipidemia Brother    Hyperlipidemia Son    Stroke Sister     SOCIAL  HISTORY Social History   Tobacco Use   Smoking status: Never   Smokeless tobacco: Never  Vaping Use   Vaping Use: Never used  Substance Use Topics   Alcohol use: Yes    Alcohol/week: 14.0 standard drinks    Types: 7 Glasses of wine, 7 Shots of liquor per week    Comment: drinks mixed drinks and wine in the night.   Drug use: No       OPHTHALMIC EXAM: Base Eye Exam     Visual Acuity (Snellen - Linear)       Right Left   Dist cc 20/20-1 20/40   Dist ph cc  20/NI    Correction: Glasses         Tonometry (Tonopen, 1:42 PM)       Right Left   Pressure 18 29         Pupils       Dark Light Shape React APD   Right 3 2 Round Brisk None   Left 3 2 Round Brisk None         Visual Fields (Counting fingers)       Left Right    Full Full         Extraocular Movement       Right Left    Full, Ortho Full, Ortho         Neuro/Psych     Oriented x3: Yes   Mood/Affect: Normal         Dilation     Both eyes: 1.0% Mydriacyl, 2.5% Phenylephrine @ 1:43 PM           Slit Lamp and Fundus Exam     Slit Lamp Exam       Right Left   Lids/Lashes Dermatochalasis - upper lid, mild MGD, RUL nasal papilloma Dermatochalasis - upper lid, mild MGD   Conjunctiva/Sclera Trace Injection Trace Injection   Cornea arcus, well healed temporal cataract wounds, 1+ Punctate epithelial erosions, mild tear film debris arcus, well healed temporal cataract wounds, 1+ PEE, tear film debris   Anterior Chamber Deep and quiet deep and clear, no cell or flare   Iris Round and dilated Round and dilated   Lens PC IOL in good position PC IOL in good position   Vitreous clear Mild Asteroid hyalosis, Vitreous syneresis         Fundus Exam       Right Left   Disc Pink and Sharp,+cupping Pink and Sharp   C/D Ratio 0.6 0.5   Macula Flat, Blunted foveal reflex, mild RPE mottling, No heme or edema Flat, hazy view, Blunted foveal reflex, small lamellar hole, cystic changes  centrally, no heme   Vessels Vascular attenuation, Tortuous mild attenuation, mild tortuousity   Periphery Attached, reticular degeneration, peripheral drusen, RPE atrophy nasal periphery Attached, reticular degeneration, peripheral drusen, hypopigmented CR scar at 0900, No heme            Refraction     Wearing Rx       Sphere Cylinder Axis Add   Right -0.75 Sphere  +2.50   Left [place] +1.00 140 2.50            IMAGING AND PROCEDURES  Imaging and Procedures for 11/26/2020  OCT, Retina - OU - Both Eyes       Right Eye Quality was good. Central Foveal Thickness: 268. Progression has been stable. Findings include normal foveal contour, no IRF, no SRF, vitreomacular adhesion .   Left Eye Quality was good. Central Foveal Thickness: 329. Progression has been stable. Findings include abnormal foveal contour, intraretinal fluid, lamellar hole, no SRF, epiretinal membrane, macular pucker, preretinal fibrosis (Persistent cystic changes, ERM with central lamellar hole).   Notes *Images captured and stored on drive  Diagnosis / Impression:  OD: NFP, no IRF/SRF OS: ERM with central lamellar hole and persistent cystic changes  Clinical management:  See below  Abbreviations: NFP - Normal foveal profile. CME - cystoid macular edema. PED - pigment epithelial detachment. IRF - intraretinal fluid. SRF - subretinal fluid. EZ - ellipsoid zone. ERM - epiretinal membrane. ORA - outer retinal atrophy. ORT - outer retinal tubulation. SRHM - subretinal hyper-reflective material. IRHM - intraretinal hyper-reflective material            ASSESSMENT/PLAN:    ICD-10-CM   1. Epiretinal membrane (ERM) of left eye  H35.372     2. Retinal edema  H35.81 OCT, Retina - OU - Both Eyes    3. 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 Essential hypertension  I10     7. Hypertensive retinopathy  of both eyes  H35.033     8. Pseudophakia of both eyes  Z96.1      1-4. 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 Asteroid hyalosis OS  - discussed diagnosis, prognosis and benign nature  - monitor   6,7. Hypertensive retinopathy OU - discussed importance of tight BP control - monitor   8. Pseudophakia OU  - s/p CE/IOL OU (Dr. Valetta Close)  - IOLs in good position - monitor   Ophthalmic Meds Ordered this visit:  No orders of the defined types were placed in this encounter.      Return in about 4 weeks (around 12/24/2020) for f/u ERM with CME OS, DFE, OCT.  There are no Patient Instructions on file for this visit.  This document serves as  a record of services personally performed by Gardiner Sleeper, MD, PhD. It was created on their behalf by Leonie Douglas, an ophthalmic technician. The creation of this record is the provider's dictation and/or activities during the visit.    Electronically signed by: Leonie Douglas COA, 11/26/20  3:35 PM  This document serves as a record of services personally performed by Gardiner Sleeper, MD, PhD. It was created on their behalf by San Jetty. Owens Shark, OA an ophthalmic technician. The creation of this record is the provider's dictation and/or activities during the visit.    Electronically signed by: San Jetty. Owens Shark, [place] 11.11.2022 3:35 PM   Gardiner Sleeper, M.D.,  Ph.D. Diseases & Surgery of the Retina and [place] 11/26/2020  I have reviewed the above documentation for accuracy and completeness, and I agree with the above. Gardiner Sleeper, M.D., Ph.D. 11/26/20 3:35 PM    Abbreviations: M myopia (nearsighted); A astigmatism; H hyperopia (farsighted); P presbyopia; Mrx spectacle prescription;  CTL contact lenses; OD right eye; OS left eye; OU both eyes  XT exotropia; ET esotropia; PEK punctate epithelial keratitis; PEE punctate epithelial erosions; DES dry eye syndrome; MGD meibomian gland dysfunction; ATs artificial tears; PFAT's preservative free artificial tears; [place] nuclear sclerotic cataract; PSC posterior subcapsular cataract; ERM epi-retinal membrane; PVD posterior vitreous detachment; RD retinal detachment; DM diabetes mellitus; DR diabetic retinopathy; NPDR non-proliferative diabetic retinopathy; PDR proliferative diabetic retinopathy; CSME clinically significant macular edema; DME diabetic macular edema; dbh dot blot hemorrhages; CWS cotton wool spot; POAG primary open angle glaucoma; C/D cup-to-disc ratio; HVF humphrey visual field; GVF goldmann visual field; OCT optical coherence tomography; IOP intraocular pressure; BRVO Branch retinal vein occlusion; CRVO central retinal vein occlusion; CRAO central retinal artery occlusion; BRAO branch retinal artery occlusion; RT retinal tear; SB scleral buckle; PPV pars plana vitrectomy; VH Vitreous hemorrhage; PRP panretinal laser photocoagulation; IVK intravitreal kenalog; VMT vitreomacular traction; MH Macular hole;  NVD neovascularization of the disc; NVE neovascularization elsewhere; AREDS age related eye disease study; ARMD age related macular degeneration; POAG primary open angle glaucoma; EBMD epithelial/anterior basement membrane dystrophy; ACIOL anterior chamber intraocular lens; IOL intraocular lens; PCIOL posterior chamber intraocular lens; Phaco/IOL phacoemulsification  with intraocular lens placement; [place] photorefractive keratectomy; LASIK laser assisted in situ keratomileusis; HTN hypertension; DM diabetes mellitus; COPD chronic obstructive pulmonary disease

## 2020-11-23 ENCOUNTER — Encounter (INDEPENDENT_AMBULATORY_CARE_PROVIDER_SITE_OTHER): Payer: Medicare Other | Admitting: Ophthalmology

## 2020-11-23 DIAGNOSIS — H35033 Hypertensive retinopathy, bilateral: Secondary | ICD-10-CM

## 2020-11-23 DIAGNOSIS — H35342 Macular cyst, hole, or pseudohole, left eye: Secondary | ICD-10-CM

## 2020-11-23 DIAGNOSIS — H3581 Retinal edema: Secondary | ICD-10-CM

## 2020-11-23 DIAGNOSIS — I10 Essential (primary) hypertension: Secondary | ICD-10-CM

## 2020-11-23 DIAGNOSIS — Z961 Presence of intraocular lens: Secondary | ICD-10-CM

## 2020-11-23 DIAGNOSIS — H35352 Cystoid macular degeneration, left eye: Secondary | ICD-10-CM

## 2020-11-23 DIAGNOSIS — H35372 Puckering of macula, left eye: Secondary | ICD-10-CM

## 2020-11-23 DIAGNOSIS — H4322 Crystalline deposits in vitreous body, left eye: Secondary | ICD-10-CM

## 2020-11-26 ENCOUNTER — Encounter: Payer: Medicare Other | Attending: Family Medicine | Admitting: Registered"

## 2020-11-26 ENCOUNTER — Ambulatory Visit (INDEPENDENT_AMBULATORY_CARE_PROVIDER_SITE_OTHER): Payer: Medicare Other | Admitting: Ophthalmology

## 2020-11-26 ENCOUNTER — Other Ambulatory Visit: Payer: Self-pay

## 2020-11-26 ENCOUNTER — Encounter (INDEPENDENT_AMBULATORY_CARE_PROVIDER_SITE_OTHER): Payer: Self-pay | Admitting: Ophthalmology

## 2020-11-26 DIAGNOSIS — I10 Essential (primary) hypertension: Secondary | ICD-10-CM

## 2020-11-26 DIAGNOSIS — H4322 Crystalline deposits in vitreous body, left eye: Secondary | ICD-10-CM | POA: Diagnosis not present

## 2020-11-26 DIAGNOSIS — H35342 Macular cyst, hole, or pseudohole, left eye: Secondary | ICD-10-CM

## 2020-11-26 DIAGNOSIS — R7303 Prediabetes: Secondary | ICD-10-CM

## 2020-11-26 DIAGNOSIS — H35372 Puckering of macula, left eye: Secondary | ICD-10-CM | POA: Diagnosis not present

## 2020-11-26 DIAGNOSIS — H35352 Cystoid macular degeneration, left eye: Secondary | ICD-10-CM | POA: Diagnosis not present

## 2020-11-26 DIAGNOSIS — Z961 Presence of intraocular lens: Secondary | ICD-10-CM

## 2020-11-26 DIAGNOSIS — H3581 Retinal edema: Secondary | ICD-10-CM

## 2020-11-26 DIAGNOSIS — H35033 Hypertensive retinopathy, bilateral: Secondary | ICD-10-CM

## 2020-11-30 DIAGNOSIS — R3915 Urgency of urination: Secondary | ICD-10-CM | POA: Diagnosis not present

## 2020-11-30 DIAGNOSIS — C61 Malignant neoplasm of prostate: Secondary | ICD-10-CM | POA: Diagnosis not present

## 2020-12-03 ENCOUNTER — Encounter: Payer: Self-pay | Admitting: Registered"

## 2020-12-03 NOTE — Progress Notes (Signed)
On 11/26/20 patient completed a post core session of the Diabetes Prevention Program course virtually with [facility]. By the end of this session patients are able to complete the following objectives:   Virtual Visit via Video Note  I connected with Jesse Goodwin by a video enabled application and verified that I am speaking with the correct person using two identifiers.  Location: Patient: Home.  Provider: Office.   Learning Objectives: Describe the differences between unsaturated, saturated, and trans fat on heart health.  List dietary sources of unsaturated, saturated, and trans fats. Explain ways to reduce intake of saturated fat and replace them with heart healthy fats.  Goals:  Record weight taken outside of class.  Track foods and beverages eaten each day in the "Food and Activity Tracker," including calories and fat grams for each item.   Track activity type, minutes you were active, and distance you reached each day in the "Food and Activity Tracker."   Follow-Up Plan: Attend next session.  Email completed "Food and Activity Trackers" before next session to be reviewed by Lifestyle Coach.

## 2020-12-17 NOTE — Progress Notes (Shared)
[facility] Note  12/24/2020     CHIEF COMPLAINT Patient presents for No chief complaint on file.   HISTORY OF PRESENT ILLNESS: Jesse Goodwin is a 75 y.o. male who presents to the clinic today for:   Pt is using PF and Prolensa BID OS as well as Cosopt Qdaily OS  Referring physician: Cari Caraway, MD [place],  [place] [address]  HISTORICAL INFORMATION:   Selected notes from the MEDICAL RECORD NUMBER Referred by Dr. Valetta Close for eval of VMT OS   CURRENT MEDICATIONS: Current Outpatient Medications (Ophthalmic Drugs)  Medication Sig   Bromfenac Sodium (PROLENSA) 0.07 % SOLN Place 1 drop into the left eye 2 (two) times daily.   dorzolamide-timolol (COSOPT) 22.3-6.8 MG/ML ophthalmic solution Place 1 drop into the left eye 2 (two) times daily.   prednisoLONE acetate (PRED FORTE) 1 % ophthalmic suspension Place 1 drop into the left eye 2 (two) times daily.   Tetrahydrozoline HCl (VISINE OP) Place 2 drops into both eyes daily as needed (dry eye).   No current facility-administered medications for this visit. (Ophthalmic Drugs)   Current Outpatient Medications (Other)  Medication Sig   amLODipine (NORVASC) 10 MG tablet Take 10 mg by mouth daily.   aspirin 325 MG EC tablet Take 325 mg by mouth daily.   azelastine (ASTELIN) 0.1 % nasal spray    losartan (COZAAR) 100 MG tablet    Nirmatrelvir & Ritonavir (PAXLOVID) 20 x 150 MG & 10 x 100MG  TBPK See admin instructions.   simvastatin (ZOCOR) 20 MG tablet Take 20 mg by mouth at bedtime.   Triamcinolone Acetonide (NASACORT ALLERGY 24HR NA) Place into the nose daily. 1 spray each nostril   Triamcinolone Acetonide (NASACORT AQ NA)    No current facility-administered medications for this visit. 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    Past Surgical History:  Procedure Laterality Date   CAROTID ENDARTERECTOMY     2014 FOR TREATMENT OF STROKE    CATARACT EXTRACTION     CHOLECYSTECTOMY     living in [place] at this time, does not remember when or who did it    EYE SURGERY     PROSTATE BIOPSY  2019   RADIOACTIVE SEED IMPLANT N/A 04/30/2017   Procedure: RADIOACTIVE SEED IMPLANT/BRACHYTHERAPY IMPLANT;  Surgeon: Cleon Gustin, MD;  [facility];  Service: Urology;  Laterality: N/A;   SPACE OAR INSTILLATION N/A 04/30/2017   Procedure: SPACE OAR INSTILLATION;  Surgeon: Cleon Gustin, MD;  Location: [facility];  Service: Urology;  Laterality: N/A;    FAMILY HISTORY Family History  Problem Relation Age of Onset   Stroke Mother    Breast cancer Mother    Dementia Mother    CAD Mother    Diabetes Mellitus II Mother    Cancer Mother        breast   Diabetes Mother    Heart disease Mother    Hyperlipidemia Mother    Hypertension Mother    Hyperlipidemia Brother    Hyperlipidemia Son    Stroke  Sister     SOCIAL HISTORY Social History   Tobacco Use   Smoking status: Never   Smokeless tobacco: Never  Vaping Use   Vaping Use: Never used  Substance Use Topics   Alcohol use: Yes    Alcohol/week: 14.0 standard drinks    Types: 7 Glasses of wine, 7 Shots of liquor per week    Comment: drinks mixed drinks and wine in the night.   Drug use: No       OPHTHALMIC EXAM: Not recorded     IMAGING AND PROCEDURES  Imaging and Procedures for 12/24/2020          ASSESSMENT/PLAN:    ICD-10-CM   1. Epiretinal membrane (ERM) of left eye  H35.372     2. Retinal edema  H35.81     3. 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  Essential hypertension  I10     7. Hypertensive retinopathy of both eyes  H35.033     8. Pseudophakia of both eyes  Z96.1       1-4. Epiretinal membrane with lamellar hole and CME OS - mild ERM OS w/ central lamellar hole and surrounding cystic changes -- ?post op CME component - BCVA 20/40 -- stable - OCT shows persistent cystic changes/IRF on PF/prolensa BID OS - FA 5.24.22 shows no leakage or CME - IOP 18,29 today on Cosopt daily OS -- ?steroid response - discussed findings, prognosis, and treatment options  - no indication for ERM surgery at this time - differential diagnosis for central cystic changes includes foveoschisis - decrease PF to Qdaily and keep Prolensa at BID -- treat post op CME component - increase Cosopt to BID OS  - f/u 4 wks -- DFE/OCT  5. Asteroid hyalosis OS  - discussed diagnosis, prognosis and benign nature  - monitor   6,7. Hypertensive retinopathy OU - discussed importance of tight BP control - monitor   8. Pseudophakia OU  - s/p CE/IOL OU (Dr. Valetta Close)  - IOLs in good position - monitor   Ophthalmic Meds Ordered this visit:  No orders of the defined types were placed in this encounter.      No follow-ups on file.  There are no Patient Instructions on file for this visit.  This document serves as a record of services personally performed by Gardiner Sleeper, MD, PhD. It was created on their behalf by Leonie Douglas, an ophthalmic technician. The creation of this record is the provider's dictation and/or activities during the visit.    Electronically signed by: Leonie Douglas COA, 12/17/20  1:59 PM   Gardiner Sleeper, M.D., Ph.D. Diseases & Surgery of the Retina and [place] 12/24/2020    Abbreviations: M myopia (nearsighted); A astigmatism; H hyperopia (farsighted); P presbyopia; Mrx spectacle prescription;  CTL contact lenses; OD right eye; OS left eye; OU both eyes  XT exotropia; ET esotropia; PEK  punctate epithelial keratitis; PEE punctate epithelial erosions; DES dry eye syndrome; MGD meibomian gland dysfunction; ATs artificial tears; PFAT's preservative free artificial tears; [place] nuclear sclerotic cataract; PSC posterior subcapsular cataract; ERM epi-retinal membrane; PVD posterior vitreous detachment; RD retinal detachment; DM diabetes mellitus; DR diabetic retinopathy; NPDR non-proliferative diabetic retinopathy; PDR proliferative diabetic retinopathy; CSME clinically significant macular edema; DME diabetic macular edema; dbh dot blot hemorrhages; CWS cotton  wool spot; POAG primary open angle glaucoma; C/D cup-to-disc ratio; HVF humphrey visual field; GVF goldmann visual field; OCT optical coherence tomography; IOP intraocular pressure; BRVO Branch retinal vein occlusion; CRVO central retinal vein occlusion; CRAO central retinal artery occlusion; BRAO branch retinal artery occlusion; RT retinal tear; SB scleral buckle; PPV pars plana vitrectomy; VH Vitreous hemorrhage; PRP panretinal laser photocoagulation; IVK intravitreal kenalog; VMT vitreomacular traction; MH Macular hole;  NVD neovascularization of the disc; NVE neovascularization elsewhere; AREDS age related eye disease study; ARMD age related macular degeneration; POAG primary open angle glaucoma; EBMD epithelial/anterior basement membrane dystrophy; ACIOL anterior chamber intraocular lens; IOL intraocular lens; PCIOL posterior chamber intraocular lens; Phaco/IOL phacoemulsification with intraocular lens placement; [place] photorefractive keratectomy; LASIK laser assisted in situ keratomileusis; HTN hypertension; DM diabetes mellitus; COPD chronic obstructive pulmonary disease

## 2020-12-23 NOTE — Progress Notes (Signed)
[facility] Note  12/27/2020     CHIEF COMPLAINT Patient presents for Retina Follow Up   HISTORY OF PRESENT ILLNESS: Jesse Goodwin is a 75 y.o. male who presents to the clinic today for:  HPI     Retina Follow Up   Patient presents with  Other.  In left eye.  Duration of 4 weeks.  Since onset it is stable.  I, the attending physician,  performed the HPI with the patient and updated documentation appropriately.        Comments   4 week follow up ERM OS- Doing well, vision appears stable, no new problems. Cosopt and Prolenza BID OS and Prednisolone qd OS.       Last edited by Bernarda Caffey, MD on 12/28/2020 12:51 PM.     Pt states vision the same OU.  Referring physician: Cari Caraway, MD [place],  [place] [id number]  HISTORICAL INFORMATION:   Selected notes from the MEDICAL RECORD NUMBER Referred by Dr. Valetta Close for eval of VMT OS   CURRENT MEDICATIONS: Current Outpatient Medications (Ophthalmic Drugs)  Medication Sig   Bromfenac Sodium (PROLENSA) 0.07 % SOLN Place 1 drop into the left eye 2 (two) times daily.   dorzolamide-timolol (COSOPT) 22.3-6.8 MG/ML ophthalmic solution Place 1 drop into the left eye 2 (two) times daily.   prednisoLONE acetate (PRED FORTE) 1 % ophthalmic suspension Place 1 drop into the left eye 2 (two) times daily.   Tetrahydrozoline HCl (VISINE OP) Place 2 drops into both eyes daily as needed (dry eye).   No current facility-administered medications for this visit. (Ophthalmic Drugs)   Current Outpatient Medications (Other)  Medication Sig   amLODipine (NORVASC) 10 MG tablet Take 10 mg by mouth daily.   aspirin 325 MG EC tablet Take 325 mg by mouth daily.   azelastine (ASTELIN) 0.1 % nasal spray    losartan (COZAAR) 100 MG tablet    Nirmatrelvir & Ritonavir (PAXLOVID) 20 x 150 MG & 10 x 100MG  TBPK See admin instructions.   simvastatin (ZOCOR) 20 MG tablet Take 20 mg by mouth at bedtime.    Triamcinolone Acetonide (NASACORT ALLERGY 24HR NA) Place into the nose daily. 1 spray each nostril   Triamcinolone Acetonide (NASACORT AQ NA)    No current facility-administered medications for this visit. (Other)   REVIEW OF SYSTEMS: ROS   Positive for: Neurological, Genitourinary, Eyes Negative for: Constitutional, Gastrointestinal, Skin, Musculoskeletal, HENT, Endocrine, Cardiovascular, Respiratory, Psychiatric, Allergic/Imm, Heme/Lymph Last edited by Leonie Douglas, COA on 12/27/2020  2:16 PM.     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    Past Surgical History:  Procedure Laterality Date   CAROTID ENDARTERECTOMY     2014 FOR TREATMENT OF STROKE    CATARACT EXTRACTION     CHOLECYSTECTOMY     living in [place] at this time, does not remember when or who did it    EYE SURGERY     PROSTATE BIOPSY  2019   RADIOACTIVE SEED IMPLANT N/A 04/30/2017   Procedure: RADIOACTIVE SEED IMPLANT/BRACHYTHERAPY IMPLANT;  Surgeon: Cleon Gustin, MD;  Location: [facility];  Service: Urology;  Laterality:  N/A;   SPACE OAR INSTILLATION N/A 04/30/2017   Procedure: SPACE OAR INSTILLATION;  Surgeon: Cleon Gustin, MD;  Location: [facility];  Service: Urology;  Laterality: N/A;   FAMILY HISTORY Family History  Problem Relation Age of Onset   Stroke Mother    Breast cancer Mother    Dementia Mother    CAD Mother    Diabetes Mellitus II Mother    Cancer Mother        breast   Diabetes Mother    Heart disease Mother    Hyperlipidemia Mother    Hypertension Mother    Hyperlipidemia Brother    Hyperlipidemia Son    Stroke Sister    SOCIAL HISTORY Social History   Tobacco Use   Smoking status:  Never   Smokeless tobacco: Never  Vaping Use   Vaping Use: Never used  Substance Use Topics   Alcohol use: Yes    Alcohol/week: 14.0 standard drinks    Types: 7 Glasses of wine, 7 Shots of liquor per week    Comment: drinks mixed drinks and wine in the night.   Drug use: No       OPHTHALMIC EXAM: Base Eye Exam     Visual Acuity (Snellen - Linear)       Right Left   Dist cc 20/20 20/40 +2   Dist ph cc  NI    Correction: Glasses         Tonometry (Tonopen, 2:23 PM)       Right Left   Pressure 13 15         Pupils       Dark Light Shape React APD   Right 3 2 Round Brisk None   Left 4 3 Round Brisk None         Visual Fields (Counting fingers)       Left Right    Full Full         Extraocular Movement       Right Left    Full Full         Neuro/Psych     Oriented x3: Yes   Mood/Affect: Normal         Dilation     Both eyes: 1.0% Mydriacyl, 2.5% Phenylephrine @ 2:23 PM           Slit Lamp and Fundus Exam     Slit Lamp Exam       Right Left   Lids/Lashes Dermatochalasis - upper lid, mild MGD, RUL nasal papilloma Dermatochalasis - upper lid, mild MGD   Conjunctiva/Sclera Trace Injection Trace Injection   Cornea arcus, well healed temporal cataract wounds, 2+Punctate epithelial erosions, mild tear film debris arcus, well healed temporal cataract wounds, 1+ PEE, tear film debris   Anterior Chamber Deep and quiet deep and clear, no cell or flare   Iris Round and dilated Round and dilated   Lens PC IOL in good position PC IOL in good position   Anterior Vitreous clear Mild Asteroid hyalosis, Vitreous syneresis         Fundus Exam       Right Left   Disc Pink and Sharp,+cupping Pink and Sharp   C/D Ratio 0.6 0.5   Macula Flat, Blunted foveal reflex, mild RPE mottling, No heme or edema Flat, hazy view, Blunted foveal reflex, small lamellar hole, cystic changes centrally--slightly improved, no heme   Vessels Vascular attenuation,  Tortuous mild attenuation, mild tortuousity   Periphery Attached,  reticular degeneration, peripheral drusen, RPE atrophy nasal periphery Attached, reticular degeneration, peripheral drusen, hypopigmented CR scar at 0900, No heme            Refraction     Wearing Rx       Sphere Cylinder Axis Add   Right -0.75 Sphere  +2.50   Left [place] +1.00 140 2.50            IMAGING AND PROCEDURES  Imaging and Procedures for 12/27/2020  OCT, Retina - OU - Both Eyes       Right Eye Quality was good. Central Foveal Thickness: 272. Progression has been stable. Findings include normal foveal contour, no IRF, no SRF, vitreomacular adhesion .   Left Eye Quality was good. Central Foveal Thickness: 338. Progression has improved. Findings include abnormal foveal contour, intraretinal fluid, lamellar hole, no SRF, epiretinal membrane, macular pucker, preretinal fibrosis (Mild interval improvement in perifoveal cystic changes, ERM with central lamellar hole).   Notes *Images captured and stored on drive  Diagnosis / Impression:  OD: NFP, no IRF/SRF OS: ERM with central lamellar hole and mild interval improvement in perifoveal cystic changes  Clinical management:  See below  Abbreviations: NFP - Normal foveal profile. CME - cystoid macular edema. PED - pigment epithelial detachment. IRF - intraretinal fluid. SRF - subretinal fluid. EZ - ellipsoid zone. ERM - epiretinal membrane. ORA - outer retinal atrophy. ORT - outer retinal tubulation. SRHM - subretinal hyper-reflective material. IRHM - intraretinal hyper-reflective material            ASSESSMENT/PLAN:    ICD-10-CM   1. Epiretinal membrane (ERM) of left eye  H35.372     2. Retinal edema  H35.81 OCT, Retina - OU - Both Eyes    3. 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 Essential hypertension  I10     7. Hypertensive  retinopathy of both eyes  H35.033     8. Pseudophakia of both eyes  Z96.1       1-4. Epiretinal membrane with lamellar hole and CME OS - mild ERM OS w/ central lamellar hole and surrounding cystic changes -- ?post op CME component - BCVA 20/40 -- stable - OCT shows mild interval improvement in perifoveal cystic changes/IRF on PF qdaily OS, and prolensa BID OS - FA 5.24.22 shows no leakage or CME - IOP 13,15 today on Cosopt bid OS -- improved OS (history of steroid reponse) - no indication for ERM surgery at this time - differential diagnosis for central cystic changes includes foveoschisis - continue PF Qdaily OS and keep Prolensa at BID OS -- treat post op CME component - continue Cosopt BID OS  - f/u 4 wks -- DFE/OCT  5. Asteroid hyalosis OS  - discussed diagnosis, prognosis and benign nature  - monitor  6,7. Hypertensive retinopathy OU - discussed importance of tight BP control - monitor  8. Pseudophakia OU  - s/p CE/IOL OU (Dr. Valetta Close)  - IOLs in good position - monitor  Ophthalmic Meds Ordered this visit:  No orders of the defined types were placed in this encounter.    Return in about 6 weeks (around 02/07/2021) for DFE, OCT.  There are no Patient Instructions on file for this visit.  This document serves as a record of services personally performed by Gardiner Sleeper, MD, PhD. It was  created on their behalf by Roselee Nova, COMT. The creation of this record is the provider's dictation and/or activities during the visit.  Electronically signed by: Roselee Nova, COMT 12/28/20 12:55 PM  This document serves as a record of services personally performed by Gardiner Sleeper, MD, PhD. It was created on their behalf by San Jetty. Owens Shark, OA an ophthalmic technician. The creation of this record is the provider's dictation and/or activities during the visit.    Electronically signed by: San Jetty. Marguerita Merles 12.12.2022 12:55 PM  Gardiner Sleeper, M.D., Ph.D. Diseases & Surgery of  the Retina and Vitreous [facility]  I have reviewed the above documentation for accuracy and completeness, and I agree with the above. Gardiner Sleeper, M.D., Ph.D. 12/28/20 12:55 PM   Abbreviations: M myopia (nearsighted); A astigmatism; H hyperopia (farsighted); P presbyopia; Mrx spectacle prescription;  CTL contact lenses; OD right eye; OS left eye; OU both eyes  XT exotropia; ET esotropia; PEK punctate epithelial keratitis; PEE punctate epithelial erosions; DES dry eye syndrome; MGD meibomian gland dysfunction; ATs artificial tears; PFAT's preservative free artificial tears; [place] nuclear sclerotic cataract; PSC posterior subcapsular cataract; ERM epi-retinal membrane; PVD posterior vitreous detachment; RD retinal detachment; DM diabetes mellitus; DR diabetic retinopathy; NPDR non-proliferative diabetic retinopathy; PDR proliferative diabetic retinopathy; CSME clinically significant macular edema; DME diabetic macular edema; dbh dot blot hemorrhages; CWS cotton wool spot; POAG primary open angle glaucoma; C/D cup-to-disc ratio; HVF humphrey visual field; GVF goldmann visual field; OCT optical coherence tomography; IOP intraocular pressure; BRVO Branch retinal vein occlusion; CRVO central retinal vein occlusion; CRAO central retinal artery occlusion; BRAO branch retinal artery occlusion; RT retinal tear; SB scleral buckle; PPV pars plana vitrectomy; VH Vitreous hemorrhage; PRP panretinal laser photocoagulation; IVK intravitreal kenalog; VMT vitreomacular traction; MH Macular hole;  NVD neovascularization of the disc; NVE neovascularization elsewhere; AREDS age related eye disease study; ARMD age related macular degeneration; POAG primary open angle glaucoma; EBMD epithelial/anterior basement membrane dystrophy; ACIOL anterior chamber intraocular lens; IOL intraocular lens; PCIOL posterior chamber intraocular lens; Phaco/IOL phacoemulsification with intraocular lens placement; [place]  photorefractive keratectomy; LASIK laser assisted in situ keratomileusis; HTN hypertension; DM diabetes mellitus; COPD chronic obstructive pulmonary disease

## 2020-12-24 ENCOUNTER — Encounter (INDEPENDENT_AMBULATORY_CARE_PROVIDER_SITE_OTHER): Payer: Medicare Other | Admitting: Ophthalmology

## 2020-12-24 ENCOUNTER — Encounter: Payer: Medicare Other | Admitting: Registered"

## 2020-12-24 DIAGNOSIS — I10 Essential (primary) hypertension: Secondary | ICD-10-CM

## 2020-12-24 DIAGNOSIS — H3581 Retinal edema: Secondary | ICD-10-CM

## 2020-12-24 DIAGNOSIS — Z961 Presence of intraocular lens: Secondary | ICD-10-CM

## 2020-12-24 DIAGNOSIS — H35372 Puckering of macula, left eye: Secondary | ICD-10-CM

## 2020-12-24 DIAGNOSIS — H4322 Crystalline deposits in vitreous body, left eye: Secondary | ICD-10-CM

## 2020-12-24 DIAGNOSIS — H35342 Macular cyst, hole, or pseudohole, left eye: Secondary | ICD-10-CM

## 2020-12-24 DIAGNOSIS — H35033 Hypertensive retinopathy, bilateral: Secondary | ICD-10-CM

## 2020-12-24 DIAGNOSIS — H35352 Cystoid macular degeneration, left eye: Secondary | ICD-10-CM

## 2020-12-27 ENCOUNTER — Encounter (INDEPENDENT_AMBULATORY_CARE_PROVIDER_SITE_OTHER): Payer: Self-pay | Admitting: Ophthalmology

## 2020-12-27 ENCOUNTER — Ambulatory Visit (INDEPENDENT_AMBULATORY_CARE_PROVIDER_SITE_OTHER): Payer: Medicare Other | Admitting: Ophthalmology

## 2020-12-27 ENCOUNTER — Other Ambulatory Visit: Payer: Self-pay

## 2020-12-27 DIAGNOSIS — H35342 Macular cyst, hole, or pseudohole, left eye: Secondary | ICD-10-CM

## 2020-12-27 DIAGNOSIS — H35372 Puckering of macula, left eye: Secondary | ICD-10-CM | POA: Diagnosis not present

## 2020-12-27 DIAGNOSIS — Z961 Presence of intraocular lens: Secondary | ICD-10-CM

## 2020-12-27 DIAGNOSIS — H35352 Cystoid macular degeneration, left eye: Secondary | ICD-10-CM

## 2020-12-27 DIAGNOSIS — H4322 Crystalline deposits in vitreous body, left eye: Secondary | ICD-10-CM

## 2020-12-27 DIAGNOSIS — H35033 Hypertensive retinopathy, bilateral: Secondary | ICD-10-CM | POA: Diagnosis not present

## 2020-12-27 DIAGNOSIS — I10 Essential (primary) hypertension: Secondary | ICD-10-CM | POA: Diagnosis not present

## 2020-12-27 DIAGNOSIS — H3581 Retinal edema: Secondary | ICD-10-CM

## 2020-12-28 ENCOUNTER — Encounter (INDEPENDENT_AMBULATORY_CARE_PROVIDER_SITE_OTHER): Payer: Self-pay | Admitting: Ophthalmology

## 2021-01-17 DIAGNOSIS — H524 Presbyopia: Secondary | ICD-10-CM | POA: Diagnosis not present

## 2021-01-17 DIAGNOSIS — H43822 Vitreomacular adhesion, left eye: Secondary | ICD-10-CM | POA: Diagnosis not present

## 2021-01-17 DIAGNOSIS — Z961 Presence of intraocular lens: Secondary | ICD-10-CM | POA: Diagnosis not present

## 2021-01-28 ENCOUNTER — Encounter: Payer: Medicare Other | Admitting: Registered"

## 2021-02-04 NOTE — Progress Notes (Addendum)
[facility] Note  02/07/2021     CHIEF COMPLAINT Patient presents for Retina Follow Up   HISTORY OF PRESENT ILLNESS: Jesse Goodwin is a 76 y.o. male who presents to the clinic today for:  HPI     Retina Follow Up   Patient presents with  Other.  In left eye.  Severity is moderate.  Duration of 6 weeks.  Since onset it is stable.  I, the attending physician,  performed the HPI with the patient and updated documentation appropriately.        Comments   Pt here for 6 wk ret f/u for ERM OS. Pt states vision is the same, no changes. Reports taking Prolensa BID OS and PF QD OS, Cosopt BID OS.       Last edited by Bernarda Caffey, MD on 02/08/2021 11:32 PM.    Pt states no change in vision, he is using Prolensa BID OS, PF qdaily OS and Cosopt BID OS  Referring physician: Jola Schmidt, MD [place],  [place] [address]  HISTORICAL INFORMATION:   Selected notes from the MEDICAL RECORD NUMBER Referred by Dr. Valetta Close for eval of VMT OS   CURRENT MEDICATIONS: Current Outpatient Medications (Ophthalmic Drugs)  Medication Sig   Tetrahydrozoline HCl (VISINE OP) Place 2 drops into both eyes daily as needed (dry eye).   Bromfenac Sodium (PROLENSA) 0.07 % SOLN Place 1 drop into the left eye 2 (two) times daily.   dorzolamide-timolol (COSOPT) 22.3-6.8 MG/ML ophthalmic solution Place 1 drop into the left eye 2 (two) times daily.   prednisoLONE acetate (PRED FORTE) 1 % ophthalmic suspension Place 1 drop into the left eye daily.   No current facility-administered medications for this visit. (Ophthalmic Drugs)   Current Outpatient Medications (Other)  Medication Sig   amLODipine (NORVASC) 10 MG tablet Take 10 mg by mouth daily.   azelastine (ASTELIN) 0.1 % nasal spray    losartan (COZAAR) 100 MG tablet    simvastatin (ZOCOR) 20 MG tablet Take 20 mg by mouth at bedtime.   Triamcinolone Acetonide (NASACORT ALLERGY 24HR NA) Place into the nose daily. 1 spray  each nostril   aspirin 325 MG EC tablet Take 325 mg by mouth daily. (Patient not taking: Reported on 02/07/2021)   Nirmatrelvir & Ritonavir (PAXLOVID) 20 x 150 MG & 10 x $Re'100MG'VJZ$  TBPK See admin instructions. (Patient not taking: Reported on 02/07/2021)   Triamcinolone Acetonide (NASACORT AQ NA)  (Patient not taking: Reported on 02/07/2021)   No current facility-administered medications for this visit. (Other)   REVIEW OF SYSTEMS: ROS   Positive for: Neurological, Genitourinary, Eyes Negative for: Constitutional, Gastrointestinal, Skin, Musculoskeletal, HENT, Endocrine, Cardiovascular, Respiratory, Psychiatric, Allergic/Imm, Heme/Lymph Last edited by Kingsley Spittle, COT on 02/07/2021  2:32 PM.      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    Past Surgical History:  Procedure Laterality Date   CAROTID ENDARTERECTOMY     2014 FOR TREATMENT OF STROKE    CATARACT EXTRACTION     CHOLECYSTECTOMY     living in [place] at this time, does not remember when or who did it    EYE  SURGERY     PROSTATE BIOPSY  2019   RADIOACTIVE SEED IMPLANT N/A 04/30/2017   Procedure: RADIOACTIVE SEED IMPLANT/BRACHYTHERAPY IMPLANT;  Surgeon: Cleon Gustin, MD;  Location: [facility];  Service: Urology;  Laterality: N/A;   SPACE OAR INSTILLATION N/A 04/30/2017   Procedure: SPACE OAR INSTILLATION;  Surgeon: Cleon Gustin, MD;  Location: [facility];  Service: Urology;  Laterality: N/A;   FAMILY HISTORY Family History  Problem Relation Age of Onset   Stroke Mother    Breast cancer Mother    Dementia Mother    CAD Mother    Diabetes Mellitus II Mother    Cancer Mother        breast   Diabetes Mother    Heart  disease Mother    Hyperlipidemia Mother    Hypertension Mother    Hyperlipidemia Brother    Hyperlipidemia Son    Stroke Sister    SOCIAL HISTORY Social History   Tobacco Use   Smoking status: Never   Smokeless tobacco: Never  Vaping Use   Vaping Use: Never used  Substance Use Topics   Alcohol use: Yes    Alcohol/week: 14.0 standard drinks    Types: 7 Glasses of wine, 7 Shots of liquor per week    Comment: drinks mixed drinks and wine in the night.   Drug use: No       OPHTHALMIC EXAM: Base Eye Exam     Visual Acuity (Snellen - Linear)       Right Left   Dist cc 20/25 +1 20/40   Dist ph cc  20/40 +1    Correction: Glasses         Tonometry (Tonopen, 2:37 PM)       Right Left   Pressure 12 17         Pupils       Dark Light Shape React APD   Right 3 2 Round Brisk None   Left 4 3 Round Minimal None         Visual Fields (Counting fingers)       Left Right    Full Full         Extraocular Movement       Right Left    Full, Ortho Full, Ortho         Neuro/Psych     Oriented x3: Yes   Mood/Affect: Normal         Dilation     Both eyes: 1.0% Mydriacyl, 2.5% Phenylephrine @ 2:38 PM           Slit Lamp and Fundus Exam     Slit Lamp Exam       Right Left   Lids/Lashes Dermatochalasis - upper lid, mild MGD, RUL nasal papilloma Dermatochalasis - upper lid, mild MGD   Conjunctiva/Sclera Trace Injection Trace Injection   Cornea arcus, well healed temporal cataract wounds, 2+Punctate epithelial erosions, mild tear film debris arcus, well healed temporal cataract wounds, trace PEE, tear film debris   Anterior Chamber Deep and quiet deep and clear, no cell or flare   Iris Round and dilated Round and dilated   Lens PC IOL in good position PC IOL in good position   Anterior Vitreous clear Mild Asteroid hyalosis, Vitreous syneresis         Fundus Exam       Right Left   Disc Pink and Sharp, +cupping Pink and Sharp   C/D Ratio  0.6  0.5   Macula Flat, Blunted foveal reflex, mild RPE mottling, No heme or edema Flat, hazy view, Blunted foveal reflex, small lamellar hole, cystic changes centrally--slightly improved, no heme   Vessels Vascular attenuation, Tortuous mild attenuation, mild tortuousity   Periphery Attached, reticular degeneration, peripheral drusen, RPE atrophy nasal periphery Attached, reticular degeneration, peripheral drusen, hypopigmented CR scar at 0900, No heme            IMAGING AND PROCEDURES  Imaging and Procedures for 02/07/2021  OCT, Retina - OU - Both Eyes       Right Eye Quality was good. Central Foveal Thickness: 267. Progression has been stable. Findings include normal foveal contour, no IRF, no SRF, vitreomacular adhesion .   Left Eye Quality was good. Central Foveal Thickness: 348. Progression has improved. Findings include abnormal foveal contour, intraretinal fluid, lamellar hole, no SRF, epiretinal membrane, macular pucker, preretinal fibrosis (Mild interval improvement in perifoveal cystic changes, ERM with central lamellar hole).   Notes *Images captured and stored on drive  Diagnosis / Impression:  OD: NFP, no IRF/SRF OS: ERM with central lamellar hole and mild interval improvement in perifoveal cystic changes  Clinical management:  See below  Abbreviations: NFP - Normal foveal profile. CME - cystoid macular edema. PED - pigment epithelial detachment. IRF - intraretinal fluid. SRF - subretinal fluid. EZ - ellipsoid zone. ERM - epiretinal membrane. ORA - outer retinal atrophy. ORT - outer retinal tubulation. SRHM - subretinal hyper-reflective material. IRHM - intraretinal hyper-reflective material            ASSESSMENT/PLAN:    ICD-10-CM   1. Epiretinal membrane (ERM) of left eye  H35.372 OCT, Retina - OU - Both Eyes    2. Lamellar macular hole, left  H35.342 OCT, Retina - OU - Both Eyes    3. 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 Essential hypertension  I10     6. Hypertensive retinopathy of both eyes  H35.033     7. Pseudophakia of both eyes  Z96.1      1-3. Epiretinal membrane with lamellar hole and CME OS - mild ERM OS w/ central lamellar hole and surrounding cystic changes -- ?post op CME component - BCVA 20/40 -- stable - OCT shows mild interval improvement in perifoveal cystic changes/IRF on PF qdaily OS, and prolensa BID OS - FA 5.24.22 shows no leakage or CME - IOP 12,17 today on Cosopt bid OS -- improved OS (history of steroid reponse) - no indication for ERM surgery at this time - differential diagnosis for central cystic changes includes foveoschisis - continue PF Qdaily OS and keep Prolensa at BID OS -- treat post op CME component - continue Cosopt BID OS  - f/u 3 months -- DFE/OCT  4. Asteroid hyalosis OS  - discussed diagnosis, prognosis and benign nature  - monitor  5,6. Hypertensive retinopathy OU - discussed importance of tight BP control - monitor  7. Pseudophakia OU  - s/p CE/IOL OU (Dr. Valetta Close)  - IOLs in good position - monitor  Ophthalmic Meds Ordered this visit:  Meds ordered this encounter  Medications   Bromfenac Sodium (PROLENSA) 0.07 % SOLN    Sig: Place 1 drop into the left eye 2 (two) times daily.    Dispense:  6 mL    Refill:  5   prednisoLONE acetate (PRED FORTE) 1 %  ophthalmic suspension    Sig: Place 1 drop into the left eye daily.    Dispense:  15 mL    Refill:  5   dorzolamide-timolol (COSOPT) 22.3-6.8 MG/ML ophthalmic solution    Sig: Place 1 drop into the left eye 2 (two) times daily.    Dispense:  10 mL    Refill:  5      Return in about 3 months (around 05/08/2021) for f/u ERM OS, DFE, OCT.  There are no Patient Instructions on file for this visit.  This document serves as a record of services personally performed by Gardiner Sleeper, MD, PhD. It was created on their behalf by Roselee Nova,  COMT. The creation of this record is the provider's dictation and/or activities during the visit.  Electronically signed by: Roselee Nova, COMT 02/08/21 11:39 PM  This document serves as a record of services personally performed by Gardiner Sleeper, MD, PhD. It was created on their behalf by San Jetty. Owens Shark, OA an ophthalmic technician. The creation of this record is the provider's dictation and/or activities during the visit.    Electronically signed by: San Jetty. Owens Shark, [place] 01.23.2023 11:39 PM  Gardiner Sleeper, M.D., Ph.D. Diseases & Surgery of the Retina and Vitreous [facility]  I have reviewed the above documentation for accuracy and completeness, and I agree with the above. Gardiner Sleeper, M.D., Ph.D. 02/08/21 11:39 PM   Abbreviations: M myopia (nearsighted); A astigmatism; H hyperopia (farsighted); P presbyopia; Mrx spectacle prescription;  CTL contact lenses; OD right eye; OS left eye; OU both eyes  XT exotropia; ET esotropia; PEK punctate epithelial keratitis; PEE punctate epithelial erosions; DES dry eye syndrome; MGD meibomian gland dysfunction; ATs artificial tears; PFAT's preservative free artificial tears; [place] nuclear sclerotic cataract; PSC posterior subcapsular cataract; ERM epi-retinal membrane; PVD posterior vitreous detachment; RD retinal detachment; DM diabetes mellitus; DR diabetic retinopathy; NPDR non-proliferative diabetic retinopathy; PDR proliferative diabetic retinopathy; CSME clinically significant macular edema; DME diabetic macular edema; dbh dot blot hemorrhages; CWS cotton wool spot; POAG primary open angle glaucoma; C/D cup-to-disc ratio; HVF humphrey visual field; GVF goldmann visual field; OCT optical coherence tomography; IOP intraocular pressure; BRVO Branch retinal vein occlusion; CRVO central retinal vein occlusion; CRAO central retinal artery occlusion; BRAO branch retinal artery occlusion; RT retinal tear; SB scleral buckle; PPV pars  plana vitrectomy; VH Vitreous hemorrhage; PRP panretinal laser photocoagulation; IVK intravitreal kenalog; VMT vitreomacular traction; MH Macular hole;  NVD neovascularization of the disc; NVE neovascularization elsewhere; AREDS age related eye disease study; ARMD age related macular degeneration; POAG primary open angle glaucoma; EBMD epithelial/anterior basement membrane dystrophy; ACIOL anterior chamber intraocular lens; IOL intraocular lens; PCIOL posterior chamber intraocular lens; Phaco/IOL phacoemulsification with intraocular lens placement; [place] photorefractive keratectomy; LASIK laser assisted in situ keratomileusis; HTN hypertension; DM diabetes mellitus; COPD chronic obstructive pulmonary disease

## 2021-02-07 ENCOUNTER — Ambulatory Visit (INDEPENDENT_AMBULATORY_CARE_PROVIDER_SITE_OTHER): Payer: Medicare Other | Admitting: Ophthalmology

## 2021-02-07 ENCOUNTER — Encounter (INDEPENDENT_AMBULATORY_CARE_PROVIDER_SITE_OTHER): Payer: Self-pay | Admitting: Ophthalmology

## 2021-02-07 ENCOUNTER — Other Ambulatory Visit: Payer: Self-pay

## 2021-02-07 DIAGNOSIS — H35372 Puckering of macula, left eye: Secondary | ICD-10-CM

## 2021-02-07 DIAGNOSIS — I10 Essential (primary) hypertension: Secondary | ICD-10-CM

## 2021-02-07 DIAGNOSIS — H35033 Hypertensive retinopathy, bilateral: Secondary | ICD-10-CM | POA: Diagnosis not present

## 2021-02-07 DIAGNOSIS — Z961 Presence of intraocular lens: Secondary | ICD-10-CM

## 2021-02-07 DIAGNOSIS — H4322 Crystalline deposits in vitreous body, left eye: Secondary | ICD-10-CM | POA: Diagnosis not present

## 2021-02-07 DIAGNOSIS — H35342 Macular cyst, hole, or pseudohole, left eye: Secondary | ICD-10-CM | POA: Diagnosis not present

## 2021-02-07 DIAGNOSIS — H35352 Cystoid macular degeneration, left eye: Secondary | ICD-10-CM | POA: Diagnosis not present

## 2021-02-07 MED ORDER — PROLENSA 0.07 % OP SOLN: 1.0000 [drp] | Freq: Two times a day (BID) | OPHTHALMIC | 5 refills | Status: DC

## 2021-02-07 MED ORDER — PREDNISOLONE ACETATE 1 % OP SUSP: 1.0000 [drp] | Freq: Every day | OPHTHALMIC | 5 refills | Status: AC

## 2021-02-07 MED ORDER — DORZOLAMIDE HCL-TIMOLOL MAL 2-0.5 % OP SOLN: 1.0000 [drp] | Freq: Two times a day (BID) | OPHTHALMIC | 5 refills | Status: DC

## 2021-02-08 ENCOUNTER — Encounter (INDEPENDENT_AMBULATORY_CARE_PROVIDER_SITE_OTHER): Payer: Self-pay | Admitting: Ophthalmology

## 2021-02-11 ENCOUNTER — Encounter: Payer: Medicare Other | Admitting: Registered"

## 2021-03-30 DIAGNOSIS — R059 Cough, unspecified: Secondary | ICD-10-CM | POA: Diagnosis not present

## 2021-05-09 ENCOUNTER — Encounter (INDEPENDENT_AMBULATORY_CARE_PROVIDER_SITE_OTHER): Payer: Medicare Other | Admitting: Ophthalmology

## 2021-05-27 NOTE — Progress Notes (Deleted)
?[facility] Note ? ?05/30/2021 ? ?  ? ?CHIEF COMPLAINT ?Patient presents for Retina Follow Up ? ? ?HISTORY OF PRESENT ILLNESS: ?Jesse Goodwin is a 76 y.o. male who presents to the clinic today for:  ?HPI   ? ? Retina Follow Up   ? ?      ? Diagnosis: Other (ERM OS w/ lamellar hole)  ? Laterality: left eye  ? Onset: years ago  ? Severity: moderate  ? Duration: 5 months  ? Course: stable  ? MD Performed: performed the HPI with the patient and updated documentation appropriately  ? ?  ?  ? ? Comments   ?Patient denies noticing any vision changes at this time. He is using Cosopt OS BID, Prolensa OS BID and Pink top OS QD. ? ?  ?  ?Last edited by Annie Paras, COT on 05/30/2021  2:05 PM.  ?  ? ?Pt states  ? ?Referring physician: ?Cari Caraway, MD ?[address] ?[place],  [place] [id number] ? ?HISTORICAL INFORMATION:  ? ?Selected notes from the [facility] ?Referred by Dr. 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 ?CURRENT MEDICATIONS: ?Current Outpatient Medications (Ophthalmic Drugs)  ?Medication Sig  ? Bromfenac Sodium (PROLENSA) 0.07 % SOLN Place 1 drop into the left eye 2 (two) times daily.  ? dorzolamide-timolol (COSOPT) 22.3-6.8 MG/ML ophthalmic solution Place 1 drop into the left eye 2 (two) times daily.  ? prednisoLONE acetate (PRED FORTE) 1 % ophthalmic suspension Place 1 drop into the left eye daily.  ? Tetrahydrozoline HCl (VISINE OP) Place 2 drops into both eyes daily as needed (dry eye).  ? ?No current facility-administered medications for this visit. (Ophthalmic Drugs)  ? ?Current Outpatient Medications (Other)  ?Medication Sig  ? amLODipine (NORVASC) 10 MG tablet Take 10 mg by mouth daily.  ? aspirin 325 MG EC tablet Take 325 mg by mouth daily.  ? azelastine (ASTELIN) 0.1 % nasal spray   ? losartan (COZAAR) 100 MG tablet   ? Nirmatrelvir & Ritonavir (PAXLOVID) 20 x 150 MG & 10 x '100MG'$  TBPK See admin instructions.  ? simvastatin (ZOCOR) 20 MG tablet Take 20 mg by mouth at  bedtime.  ? Triamcinolone Acetonide (NASACORT ALLERGY 24HR NA) Place into the nose daily. 1 spray each nostril  ? Triamcinolone Acetonide (NASACORT AQ NA)   ? ?No current facility-administered medications for this visit. (Other)  ? ?REVIEW OF SYSTEMS: ?ROS   ?Positive for: Neurological, Genitourinary, Eyes ?Negative for: Constitutional, Gastrointestinal, Skin, Musculoskeletal, HENT, Endocrine, Cardiovascular, Respiratory, Psychiatric, Allergic/Imm, Heme/Lymph ?Last edited by Annie Paras, COT on 05/30/2021  2:05 PM.  ?  ? ? ? ?ALLERGIES ?No Known Allergies ? ?PAST MEDICAL HISTORY ?Past Medical History:  ?Diagnosis Date  ? Carotid artery occlusion   ? Hypercholesteremia   ? Hypertension   ? Hypertensive retinopathy   ? Pre-diabetes   ? pre-dm   ? Prostate cancer ([place])   ? Stroke [facility])   ? 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 ?Past Surgical History:  ?Procedure Laterality Date  ? CAROTID ENDARTERECTOMY    ? 2014 FOR TREATMENT OF STROKE   ? CATARACT EXTRACTION    ? CHOLECYSTECTOMY    ? living in [place] at this time, does not remember when or who did it   ? EYE SURGERY    ? PROSTATE BIOPSY  2019  ? RADIOACTIVE SEED IMPLANT N/A 04/30/2017  ? Procedure: RADIOACTIVE SEED IMPLANT/BRACHYTHERAPY IMPLANT;  Surgeon:  McKenzie, Candee Furbish, MD;  [facility];  Service: Urology;  Laterality: N/A;  ? SPACE OAR INSTILLATION N/A 04/30/2017  ? Procedure: SPACE OAR INSTILLATION;  Surgeon: Cleon Gustin, MD;  Location: [facility];  Service: Urology;  Laterality: N/A;  ? ?FAMILY HISTORY ?Family History  ?Problem Relation Age of Onset  ? Stroke Mother   ? Breast cancer Mother   ? Dementia Mother   ? CAD Mother   ? Diabetes Mellitus II Mother   ? Cancer Mother   ?     breast  ? Diabetes Mother   ? Heart disease Mother   ? Hyperlipidemia Mother   ? Hypertension Mother   ? Hyperlipidemia Brother   ? Hyperlipidemia Son   ? Stroke Sister   ? ?SOCIAL HISTORY ?Social History  ? ?Tobacco  Use  ? Smoking status: Never  ? Smokeless tobacco: Never  ?Vaping Use  ? Vaping Use: Never used  ?Substance Use Topics  ? Alcohol use: Yes  ?  Alcohol/week: 14.0 standard drinks  ?  Types: 7 Glasses of wine, 7 Shots of liquor per week  ?  Comment: drinks mixed drinks and wine in the night.  ? Drug use: No  ?  ? ?  ?OPHTHALMIC EXAM: ?Base Eye Exam   ? ? Visual Acuity (Snellen - Linear)   ? ?   Right Left  ? Dist cc 20/20 20/40  ? Dist ph cc  NI  ? ? Correction: Glasses  ? ?  ?  ? ? Tonometry (Tonopen, 2:08 PM)   ? ?   Right Left  ? Pressure 15 17  ? ?  ?  ? ? Pupils   ? ?   Dark Light Shape React APD  ? Right 3 2 Round Brisk None  ? Left 4 3 Round Minimal None  ? ?  ?  ? ? Visual Fields   ? ?   Left Right  ?  Full Full  ? ?  ?  ? ? Extraocular Movement   ? ?   Right Left  ?  Full Full  ? ?  ?  ? ? Neuro/Psych   ? ? Oriented x3: Yes  ? ?  ?  ? ? Dilation   ? ? Both eyes: 1.0% Mydriacyl, 2.5% Phenylephrine @ 2:06 PM  ? ?  ?  ? ?  ? ?Refraction   ? ? Wearing Rx   ? ?   Sphere Cylinder Axis Add  ? Right -0.75 Sphere  +2.50  ? Left [place] +1.00 140 2.50  ? ?  ?  ? ?  ? ? ?IMAGING AND PROCEDURES  ?Imaging and Procedures for 05/30/2021 ? ? ?  ?  ? ?  ?ASSESSMENT/PLAN: ? ?  ICD-10-CM   ?1. Epiretinal membrane (ERM) of left eye  H35.372 OCT, Retina - OU - Both Eyes  ?  ?2. Lamellar macular hole, left  H35.342   ?  ?3. CME (cystoid macular edema), left  H35.352   ?  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  ?5. Essential hypertension  I10   ?  ?6. Hypertensive retinopathy of both eyes  H35.033   ?  ?7. Pseudophakia of both eyes  Z96.1   ?  ? ? ?1-3. Epiretinal membrane with lamellar hole and CME OS ?- mild ERM OS w/ central lamellar hole and surrounding cystic changes -- ?post op CME component ?- BCVA 20/40 -- stable ?-  OCT shows mild interval improvement in perifoveal cystic changes/IR, persistent PRF / partial PVD overlying disc -- on PF qdaily OS, and prolensa BID OS ?- FA 05.24.22 shows no leakage or CME ?- IOP 15,17  today on Cosopt bid OS -- improved OS (history of steroid reponse) ?- no indication for ERM surgery at this time ?- differential diagnosis for central cystic changes includes foveoschisis ?- continue PF Qdaily OS and keep Prolensa at BID OS -- treat post op CME component ?- continue Cosopt BID OS  ?- f/u 3 months -- DFE/OCT ? ?4. Asteroid hyalosis OS ? - discussed diagnosis, prognosis and benign nature ? - monitor ? ?5,6. Hypertensive retinopathy OU ?- discussed importance of tight BP control ?- monitor ? ?7. Pseudophakia OU ? - s/p CE/IOL OU (Dr. Valetta Close) ? - IOLs in good position ?- monitor ? ?Ophthalmic Meds Ordered this visit:  ?No orders of the defined types were placed in this encounter. ? ?  ? ?No follow-ups on file. ? ?There are no Patient Instructions on file for this visit. ? ?This document serves as a record of services personally performed by Gardiner Sleeper, MD, PhD. It was created on their behalf by Roselee Nova, COMT. The creation of this record is the provider's dictation and/or activities during the visit. ? ?Electronically signed by: Roselee Nova, COMT 05/30/21 2:24 PM ? ?This document serves as a record of services personally performed by Gardiner Sleeper, MD, PhD. It was created on their behalf by San Jetty. Owens Shark, OA an ophthalmic technician. The creation of this record is the provider's dictation and/or activities during the visit.   ? ?Electronically signed by: San Jetty. Owens Shark, [place] 05.15.2023 2:24 PM ? ? ? ?Gardiner Sleeper, M.D., Ph.D. ?Diseases & Surgery of the Retina and Vitreous ?[place] ? ? ? ? ?Abbreviations: ?M myopia (nearsighted); A astigmatism; H hyperopia (farsighted); P presbyopia; Mrx spectacle prescription;  CTL contact lenses; OD right eye; OS left eye; OU both eyes  XT exotropia; ET esotropia; PEK punctate epithelial keratitis; PEE punctate epithelial erosions; DES dry eye syndrome; MGD meibomian gland dysfunction; ATs artificial tears; PFAT's preservative  free artificial tears; [place] nuclear sclerotic cataract; PSC posterior subcapsular cataract; ERM epi-retinal membrane; PVD posterior vitreous detachment; RD retinal detachment; DM diabetes mellitus; DR diabe

## 2021-05-30 ENCOUNTER — Encounter (INDEPENDENT_AMBULATORY_CARE_PROVIDER_SITE_OTHER): Payer: Medicare Other | Admitting: Ophthalmology

## 2021-05-30 ENCOUNTER — Encounter (INDEPENDENT_AMBULATORY_CARE_PROVIDER_SITE_OTHER): Payer: Self-pay | Admitting: Ophthalmology

## 2021-05-30 ENCOUNTER — Ambulatory Visit (INDEPENDENT_AMBULATORY_CARE_PROVIDER_SITE_OTHER): Payer: Medicare Other | Admitting: Ophthalmology

## 2021-05-30 DIAGNOSIS — H35352 Cystoid macular degeneration, left eye: Secondary | ICD-10-CM

## 2021-05-30 DIAGNOSIS — Z961 Presence of intraocular lens: Secondary | ICD-10-CM

## 2021-05-30 DIAGNOSIS — I10 Essential (primary) hypertension: Secondary | ICD-10-CM | POA: Diagnosis not present

## 2021-05-30 DIAGNOSIS — H35372 Puckering of macula, left eye: Secondary | ICD-10-CM

## 2021-05-30 DIAGNOSIS — H35033 Hypertensive retinopathy, bilateral: Secondary | ICD-10-CM

## 2021-05-30 DIAGNOSIS — H4322 Crystalline deposits in vitreous body, left eye: Secondary | ICD-10-CM | POA: Diagnosis not present

## 2021-05-30 DIAGNOSIS — H35342 Macular cyst, hole, or pseudohole, left eye: Secondary | ICD-10-CM

## 2021-05-30 NOTE — Progress Notes (Signed)
?[facility] Note ? ?05/30/2021 ? ?  ? ?CHIEF COMPLAINT ?Patient presents for Retina Follow Up ? ? ?HISTORY OF PRESENT ILLNESS: ?Jesse Goodwin is a 76 y.o. male who presents to the clinic today for:  ?HPI   ? ? Retina Follow Up   ?Patient presents with  Other (ERM OS w/ lemellar hole).  In left eye.  This started years ago.  Severity is moderate.  Duration of 5 months.  Since onset it is stable.  I, the attending physician,  performed the HPI with the patient and updated documentation appropriately. ? ?  ?  ? ? Comments   ?Patient denies noticing any vision changes at this time. He is using Cosopt OS BID, Prolensa OS BID and Pink top OS QD. ? ?  ?  ?Last edited by Bernarda Caffey, MD on 05/30/2021 11:42 PM.  ?  ?Pt states no change in vision, he is using Prolensa BID OS, PF qdaily OS and Cosopt BID OS ? ?Referring physician: ?Cari Caraway, MD ?[address] ?[place],  [place] [address] ? ?HISTORICAL INFORMATION:  ? ?Selected notes from the [place] ?Referred by Dr. 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 ?CURRENT MEDICATIONS: ?Current Outpatient Medications (Ophthalmic Drugs)  ?Medication Sig  ? Bromfenac Sodium (PROLENSA) 0.07 % SOLN Place 1 drop into the left eye 2 (two) times daily.  ? dorzolamide-timolol (COSOPT) 22.3-6.8 MG/ML ophthalmic solution Place 1 drop into the left eye 2 (two) times daily.  ? prednisoLONE acetate (PRED FORTE) 1 % ophthalmic suspension Place 1 drop into the left eye daily.  ? Tetrahydrozoline HCl (VISINE OP) Place 2 drops into both eyes daily as needed (dry eye).  ? ?No current facility-administered medications for this visit. (Ophthalmic Drugs)  ? ?Current Outpatient Medications (Other)  ?Medication Sig  ? amLODipine (NORVASC) 10 MG tablet Take 10 mg by mouth daily.  ? aspirin 325 MG EC tablet Take 325 mg by mouth daily.  ? azelastine (ASTELIN) 0.1 % nasal spray   ? losartan (COZAAR) 100 MG tablet   ? Nirmatrelvir & Ritonavir (PAXLOVID) 20 x 150 MG & 10 x  '100MG'$  TBPK See admin instructions.  ? simvastatin (ZOCOR) 20 MG tablet Take 20 mg by mouth at bedtime.  ? Triamcinolone Acetonide (NASACORT ALLERGY 24HR NA) Place into the nose daily. 1 spray each nostril  ? Triamcinolone Acetonide (NASACORT AQ NA)   ? ?No current facility-administered medications for this visit. (Other)  ? ?REVIEW OF SYSTEMS: ?ROS   ?Positive for: Neurological, Genitourinary, Eyes ?Negative for: Constitutional, Gastrointestinal, Skin, Musculoskeletal, HENT, Endocrine, Cardiovascular, Respiratory, Psychiatric, Allergic/Imm, Heme/Lymph ?Last edited by Leonie Douglas, COA on 05/30/2021  2:49 PM.  ?  ? ? ? ?ALLERGIES ?No Known Allergies ? ?PAST MEDICAL HISTORY ?Past Medical History:  ?Diagnosis Date  ? Carotid artery occlusion   ? Hypercholesteremia   ? Hypertension   ? Hypertensive retinopathy   ? Pre-diabetes   ? pre-dm   ? Prostate cancer ([place])   ? Stroke [facility])   ? 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 ?Past Surgical History:  ?Procedure Laterality Date  ? CAROTID ENDARTERECTOMY    ? 2014 FOR TREATMENT OF STROKE   ? CATARACT EXTRACTION    ? CHOLECYSTECTOMY    ? living in [place] at this time, does not remember when or who did it   ? EYE SURGERY    ? PROSTATE BIOPSY  2019  ? RADIOACTIVE SEED IMPLANT N/A 04/30/2017  ?  Procedure: RADIOACTIVE SEED IMPLANT/BRACHYTHERAPY IMPLANT;  Surgeon: Cleon Gustin, MD;  [facility];  Service: Urology;  Laterality: N/A;  ? SPACE OAR INSTILLATION N/A 04/30/2017  ? Procedure: SPACE OAR INSTILLATION;  Surgeon: Cleon Gustin, MD;  [facility];  Service: Urology;  Laterality: N/A;  ? ?FAMILY HISTORY ?Family History  ?Problem Relation Age of Onset  ? Stroke Mother   ? Breast cancer Mother   ? Dementia Mother   ? CAD Mother   ? Diabetes Mellitus II Mother   ? Cancer Mother   ?     breast  ? Diabetes Mother   ? Heart disease Mother   ? Hyperlipidemia Mother   ? Hypertension Mother   ? Hyperlipidemia Brother   ?  Hyperlipidemia Son   ? Stroke Sister   ? ?SOCIAL HISTORY ?Social History  ? ?Tobacco Use  ? Smoking status: Never  ? Smokeless tobacco: Never  ?Vaping Use  ? Vaping Use: Never used  ?Substance Use Topics  ? Alcohol use: Yes  ?  Alcohol/week: 14.0 standard drinks  ?  Types: 7 Glasses of wine, 7 Shots of liquor per week  ?  Comment: drinks mixed drinks and wine in the night.  ? Drug use: No  ?  ? ?  ?OPHTHALMIC EXAM: ?Base Eye Exam   ? ? Visual Acuity (Snellen - Linear)   ? ?   Right Left  ? Dist cc 20/20 20/40  ? Dist ph cc  NI  ? ? Correction: Glasses  ? ?  ?  ? ? Tonometry (Tonopen, 2:54 PM)   ? ?   Right Left  ? Pressure 15 17  ? ?  ?  ? ? Pupils   ? ?   Dark Light Shape React APD  ? Right 3 2 Round Brisk None  ? Left 4 3 Round Minimal None  ? ?  ?  ? ? Visual Fields (Counting fingers)   ? ?   Left Right  ?  Full Full  ? ?  ?  ? ? Extraocular Movement   ? ?   Right Left  ?  Full, Ortho Full, Ortho  ? ?  ?  ? ? Neuro/Psych   ? ? Oriented x3: Yes  ? Mood/Affect: Normal  ? ?  ?  ? ? Dilation   ? ? Both eyes: 1.0% Mydriacyl, 2.5% Phenylephrine @ 2:06 PM  ? ?  ?  ? ?  ? ?Slit Lamp and Fundus Exam   ? ? Slit Lamp Exam   ? ?   Right Left  ? Lids/Lashes Dermatochalasis - upper lid, mild MGD, RUL nasal papilloma Dermatochalasis - upper lid, mild MGD  ? Conjunctiva/Sclera Trace Injection Trace Injection  ? Cornea arcus, well healed temporal cataract wounds, 1-2+Punctate epithelial erosions, mild tear film debris arcus, well healed temporal cataract wounds, trace PEE, tear film debris, trace EBMD  ? Anterior Chamber Deep and quiet deep and clear, no cell or flare  ? Iris Round and dilated Round and dilated  ? Lens PC IOL in good position PC IOL in good position  ? Anterior Vitreous clear Mild Asteroid hyalosis, Vitreous syneresis  ? ?  ?  ? ? Fundus Exam   ? ?   Right Left  ? Disc Pink and Sharp, +cupping Pink and Sharp  ? C/D Ratio 0.65 0.5  ? Macula Flat, Blunted foveal reflex, mild RPE mottling, No heme or edema Flat,  hazy view,  Blunted foveal reflex, small lamellar hole, cystic changes centrally--slightly improved, no heme  ? Vessels Vascular attenuation, Tortuous mild attenuation, mild tortuosity, mild AV crossing changes  ? Periphery Attached, reticular degeneration, peripheral drusen, RPE atrophy nasal periphery Attached, reticular degeneration, peripheral drusen, hypopigmented CR scar at 0900, No heme  ? ?  ?  ? ?  ? ?Refraction   ? ? Wearing Rx   ? ?   Sphere Cylinder Axis Add  ? Right -0.75 Sphere  +2.50  ? Left [place] +1.00 140 2.50  ? ?  ?  ? ?  ? ? ?IMAGING AND PROCEDURES  ?Imaging and Procedures for 05/30/2021 ? ?OCT, Retina - OU - Both Eyes   ? ?   ?Right Eye ?Quality was good. Central Foveal Thickness: 267. Progression has been stable. Findings include normal foveal contour, no IRF, no SRF, vitreomacular adhesion .  ? ?Left Eye ?Quality was good. Central Foveal Thickness: 348. Progression has improved. Findings include abnormal foveal contour, intraretinal fluid, lamellar hole, no SRF, epiretinal membrane, macular pucker, preretinal fibrosis (Mild interval improvement in perifoveal cystic changes, ERM with central lamellar hole).  ? ?Notes ?*Images captured and stored on drive ? ?Diagnosis / Impression:  ?OD: NFP, no IRF/SRF ?OS: ERM with central lamellar hole and mild interval improvement in perifoveal cystic changes ? ?Clinical management:  ?See below ? ?Abbreviations: NFP - Normal foveal profile. CME - cystoid macular edema. PED - pigment epithelial detachment. IRF - intraretinal fluid. SRF - subretinal fluid. EZ - ellipsoid zone. ERM - epiretinal membrane. ORA - outer retinal atrophy. ORT - outer retinal tubulation. SRHM - subretinal hyper-reflective material. IRHM - intraretinal hyper-reflective material ? ? ?  ?  ?  ? ?  ?ASSESSMENT/PLAN: ? ?  ICD-10-CM   ?1. Epiretinal membrane (ERM) of left eye  H35.372 OCT, Retina - OU - Both Eyes  ?  ?2. Lamellar macular hole, left  H35.342 OCT, Retina - OU - Both Eyes  ?   ?3. CME (cystoid macular edema), left  H35.352 OCT, Retina - OU - Both Eyes  ?  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  ?5. Essential hypertension  I10   ?  ?6. Hypertensive retinopathy of both eyes  H35

## 2021-05-30 NOTE — Progress Notes (Signed)
This encounter was created in error - please disregard.

## 2021-06-04 IMAGING — CT NM PET SKULL BASE TO THIGH
7 series · 25 of 25 positions shown · non-contrast
Comparison: CT 04/27/2020

CLINICAL DATA: Elevated chromogranin A. history of prostate
carcinoma. Indeterminate lesion in pancreas CT imaging.

EXAM:
NUCLEAR MEDICINE PET SKULL BASE TO THIGH
TECHNIQUE: 5.3 mCi gallium 68 DOTATATE was injected intravenously. Full-ring
PET imaging was performed from the skull base to thigh after the
radiotracer. CT data was obtained and used for attenuation
correction and anatomic localization.

[Series 3: pet sk_thigh ac · axial · 5.0mm · 4.07mm/px · z∈[-1558,-594]mm · 6 of 242 slices shown]
[im 1/242]
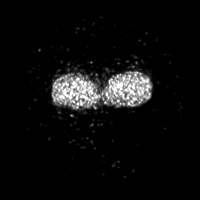
[im 49/242]
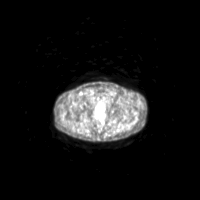
[im 97/242]
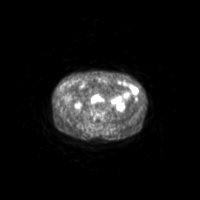
[im 145/242]
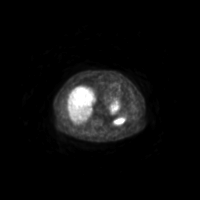
[im 193/242]
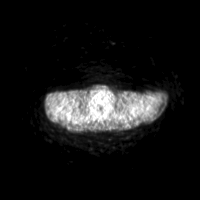
[im 242/242]
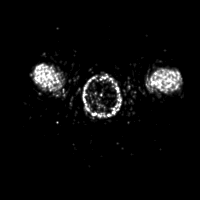

[Series 4: ct sk_thigh 5.0 bf37 · axial · 5.0mm · 0.98mm/px · z∈[-1558,-594]mm · 5 of 242 slices shown]
[im 1/242]
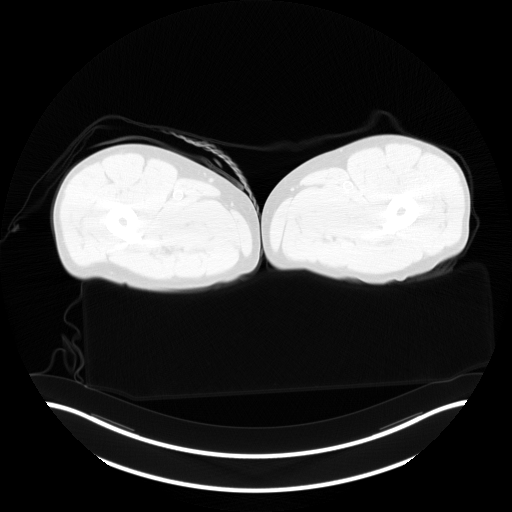
[im 61/242]
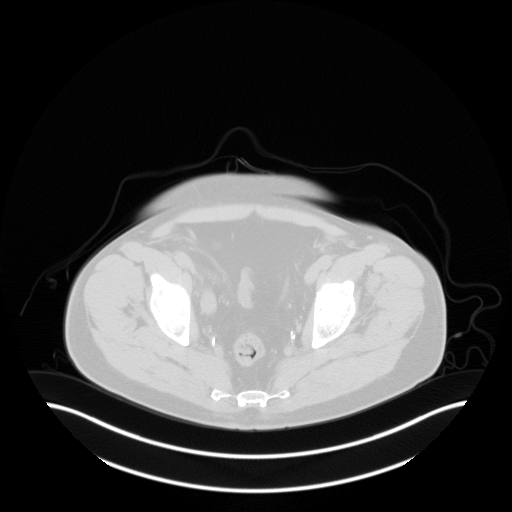
[im 121/242]
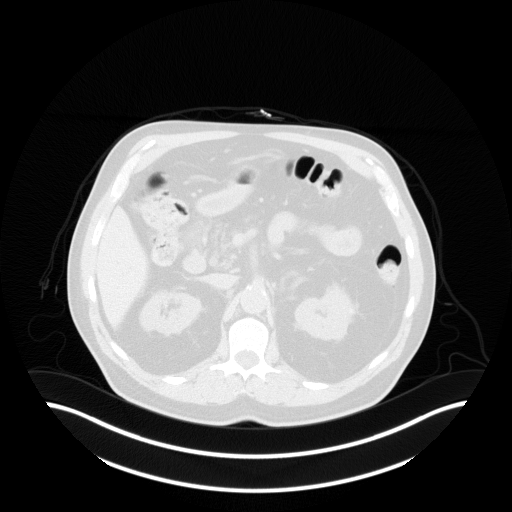
[im 181/242]
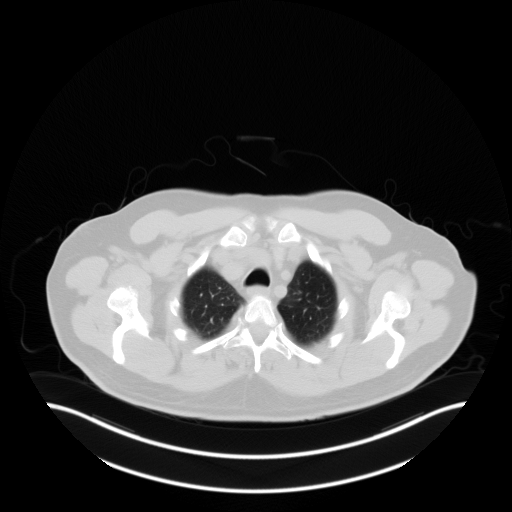
[im 242/242  brain]
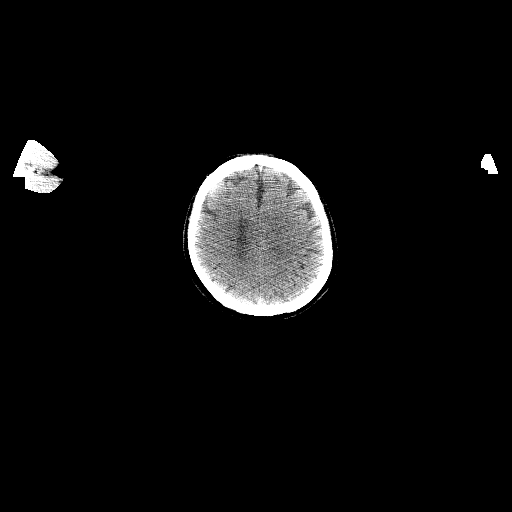

[Series 5: pet sk_thigh nac · axial · 5.0mm · 4.07mm/px · z∈[-1558,-594]mm · 5 of 242 slices shown]
[im 1/242]
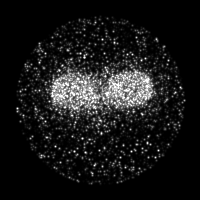
[im 61/242]
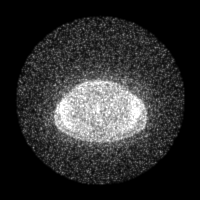
[im 121/242]
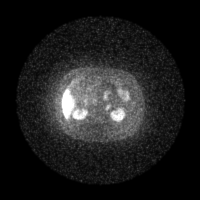
[im 181/242]
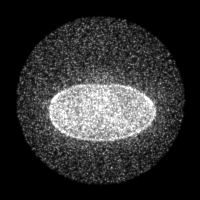
[im 242/242]
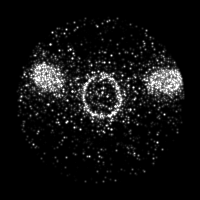

[Series 8: ct sk_thigh 5.0 br59 lung_bone · axial · 5.0mm · 0.78mm/px · z∈[-1078,-790]mm · 2 of 73 slices shown]
[im 1/73]
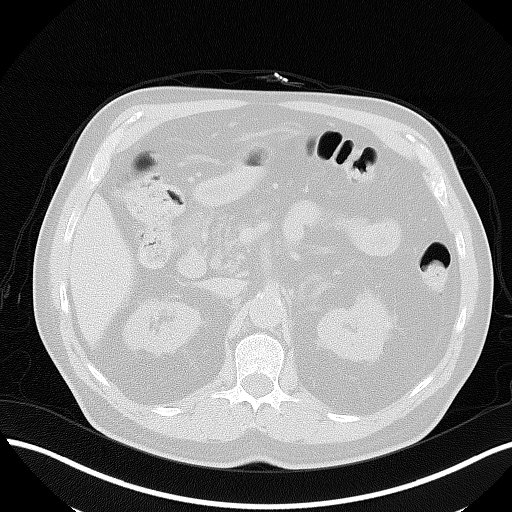
[im 73/73]
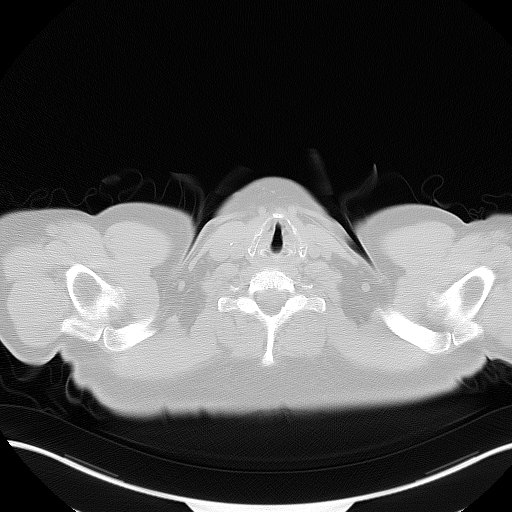

[Series 603: <mip collection> · coronal · 2.00mm/px · 1 of 32 slices shown]
[im 1/32]
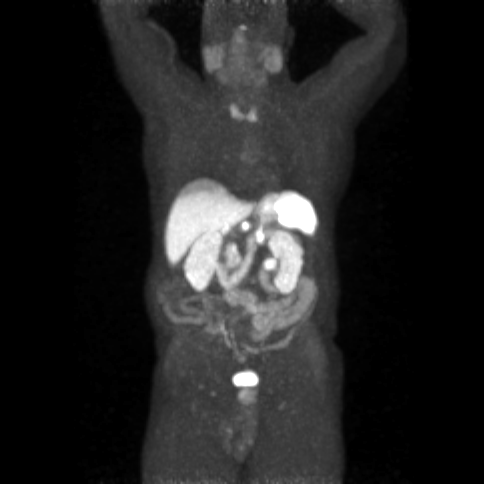

[Series 605: fused cor · 1 of 52 slices shown]
[im 1/52]
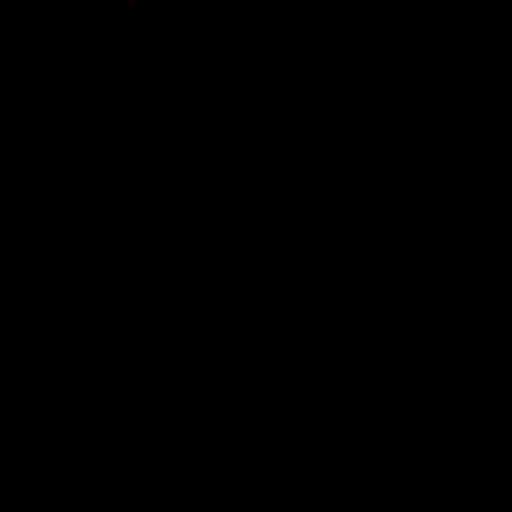

[Series 606: range-ct sk_thigh 5.0 bf37-tra-<alpha range> · 5 of 236 slices shown]
[im 1/236]
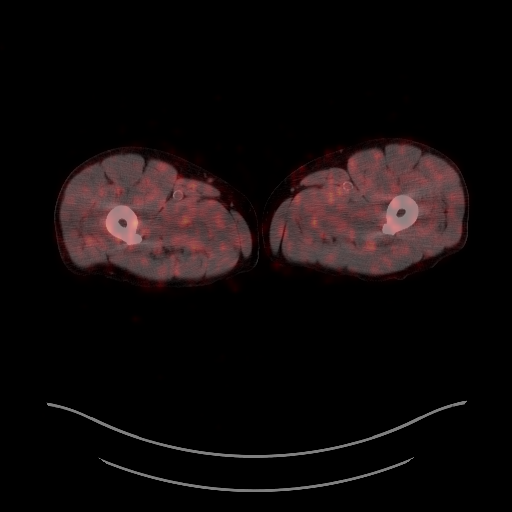
[im 59/236]
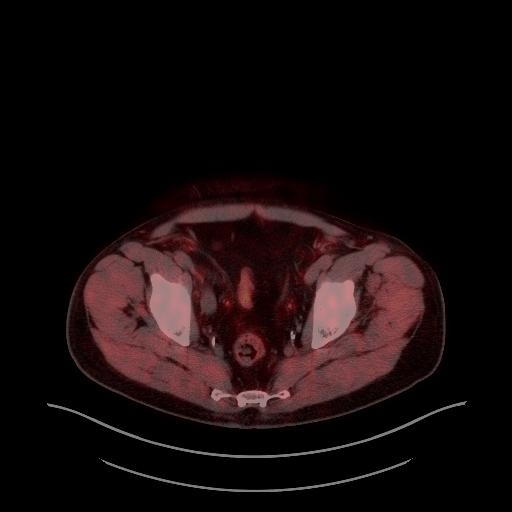
[im 118/236]
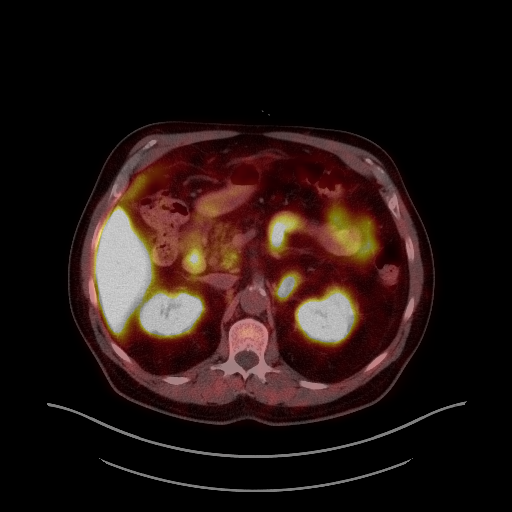
[im 177/236]
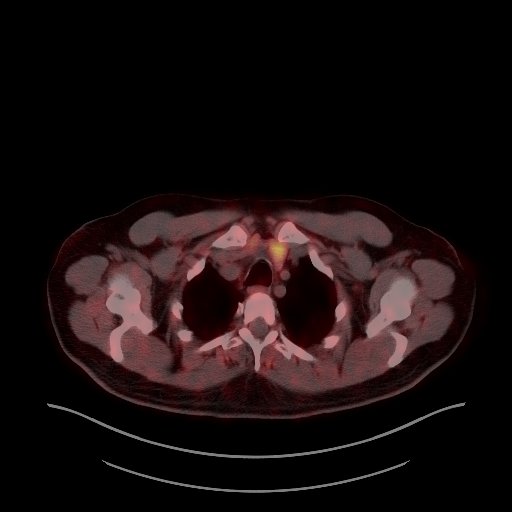
[im 236/236]
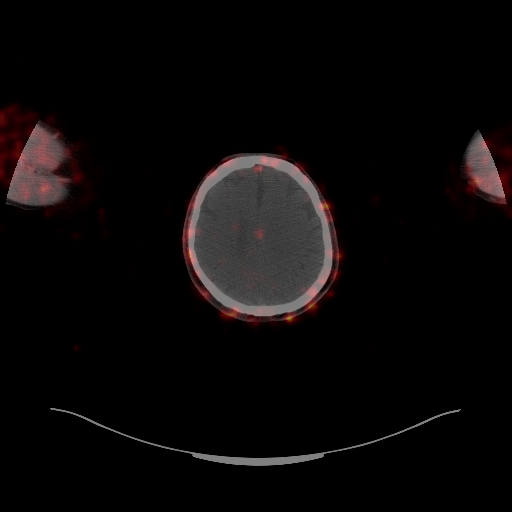

[25 of 25 positions shown; findings below may reference images not displayed]

FINDINGS: NECK

No radiotracer activity in neck lymph nodes.

Incidental CT findings: None

CHEST

No radiotracer accumulation within mediastinal or hilar lymph nodes.
No suspicious pulmonary nodules on the CT scan.

Incidental CT finding:None

ABDOMEN/PELVIS

Nodule along the ventral surface of the mid pancreatic body has
intense radiotracer activity with SUV max equal 26.9. Nodule
measures 1.5 by 1.4 cm on image 114.

No radiotracer avid lymph nodes in the adjacent peripancreatic
retroperitoneum. There is intense activity associated the uncinate
of the pancreas which is a known physiologic phenomena.

No evidence of liver metastasis, mesenteric metastasis or abnormal
bowel lesions.

Physiologic activity noted in the liver, spleen, adrenal glands and
kidneys.

Incidental CT findings:Brachytherapy seeds in the prostate gland.
Atherosclerotic calcification of the aorta.

Perirectal lymph node measuring 10 mm (image 184/4) again noted and
described on comparison CT. No radiotracer activity.

SKELETON

No focal activity to suggest skeletal metastasis.

Incidental CT findings:None
IMPRESSION: 1. Solid nodule in the body of the pancreas with intense radiotracer
activity consistent well differentiated neuroendocrine tumor.
Findings consistent with pancreatic primary neuroendocrine tumor.
2. No evidence of metastatic adenopathy, liver metastasis,
mesenteric metastasis or bowel lesions.
3. Perirectal lymph node described on prior CT. No radiotracer
activity.

## 2021-08-24 DIAGNOSIS — M25572 Pain in left ankle and joints of left foot: Secondary | ICD-10-CM | POA: Diagnosis not present

## 2021-08-24 NOTE — Progress Notes (Signed)
[facility] Note  09/06/2021     CHIEF COMPLAINT Patient presents for Retina Follow Up   HISTORY OF PRESENT ILLNESS: Jesse Goodwin is a 76 y.o. male who presents to the clinic today for:  HPI     Retina Follow Up   Patient presents with  Other.  In left eye.  Severity is moderate.  Duration of 3 months.  Since onset it is stable.  I, the attending physician,  performed the HPI with the patient and updated documentation appropriately.        Comments   Pt here for 3 mo ret f/u ERM OS. PT states VA is the same, seems stable. No changes except new rx specs.       Last edited by Bernarda Caffey, MD on 09/07/2021  1:34 AM.    Pt states vision is "very stable", pt got a new Rx for glasses in January, but just got it filled last week  Referring physician: Cari Caraway, MD [place],  [place] [address]  HISTORICAL INFORMATION:   Selected notes from the MEDICAL RECORD NUMBER Referred by Dr. Valetta Close for eval of VMT OS   CURRENT MEDICATIONS: Current Outpatient Medications (Ophthalmic Drugs)  Medication Sig   Bromfenac Sodium (PROLENSA) 0.07 % SOLN Place 1 drop into the left eye 2 (two) times daily.   dorzolamide-timolol (COSOPT) 22.3-6.8 MG/ML ophthalmic solution Place 1 drop into the left eye 2 (two) times daily.   prednisoLONE acetate (PRED FORTE) 1 % ophthalmic suspension Place 1 drop into the left eye daily.   Tetrahydrozoline HCl (VISINE OP) Place 2 drops into both eyes daily as needed (dry eye).   No current facility-administered medications for this visit. (Ophthalmic Drugs)   Current Outpatient Medications (Other)  Medication Sig   amLODipine (NORVASC) 10 MG tablet Take 10 mg by mouth daily.   aspirin 325 MG EC tablet Take 325 mg by mouth daily.   azelastine (ASTELIN) 0.1 % nasal spray    losartan (COZAAR) 100 MG tablet    Nirmatrelvir & Ritonavir (PAXLOVID) 20 x 150 MG & 10 x $Re'100MG'Zdy$  TBPK See admin instructions.   simvastatin  (ZOCOR) 20 MG tablet Take 20 mg by mouth at bedtime.   Triamcinolone Acetonide (NASACORT ALLERGY 24HR NA) Place into the nose daily. 1 spray each nostril   Triamcinolone Acetonide (NASACORT AQ NA)    No current facility-administered medications for this visit. (Other)   REVIEW OF SYSTEMS: ROS   Positive for: Neurological, Genitourinary, Eyes Negative for: Constitutional, Gastrointestinal, Skin, Musculoskeletal, HENT, Endocrine, Cardiovascular, Respiratory, Psychiatric, Allergic/Imm, Heme/Lymph Last edited by Kingsley Spittle, COT on 09/06/2021  2:11 PM.     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    Past Surgical History:  Procedure Laterality Date   CAROTID ENDARTERECTOMY     2014 FOR TREATMENT OF STROKE    CATARACT EXTRACTION     CHOLECYSTECTOMY     living in [place] at this time, does not remember when or who did it    EYE SURGERY     PROSTATE BIOPSY  2019   RADIOACTIVE SEED IMPLANT N/A 04/30/2017   Procedure: RADIOACTIVE SEED IMPLANT/BRACHYTHERAPY  IMPLANT;  Surgeon: Cleon Gustin, MD;  Location: [facility];  Service: Urology;  Laterality: N/A;   SPACE OAR INSTILLATION N/A 04/30/2017   Procedure: SPACE OAR INSTILLATION;  Surgeon: Cleon Gustin, MD;  Location: [facility];  Service: Urology;  Laterality: N/A;   FAMILY HISTORY Family History  Problem Relation Age of Onset   Stroke Mother    Breast cancer Mother    Dementia Mother    CAD Mother    Diabetes Mellitus II Mother    Cancer Mother        breast   Diabetes Mother    Heart disease Mother    Hyperlipidemia Mother    Hypertension Mother    Hyperlipidemia Brother    Hyperlipidemia Son    Stroke Sister    SOCIAL  HISTORY Social History   Tobacco Use   Smoking status: Never   Smokeless tobacco: Never  Vaping Use   Vaping Use: Never used  Substance Use Topics   Alcohol use: Yes    Alcohol/week: 14.0 standard drinks of alcohol    Types: 7 Glasses of wine, 7 Shots of liquor per week    Comment: drinks mixed drinks and wine in the night.   Drug use: No       OPHTHALMIC EXAM: Base Eye Exam     Visual Acuity (Snellen - Linear)       Right Left   Dist cc 20/20 20/40 -2   Dist ph cc  20/40    Correction: Glasses         Tonometry (Tonopen, 2:14 PM)       Right Left   Pressure 10 18         Pupils       Dark Light Shape React APD   Right 3 2 Round Brisk None   Left 4 3 Round Brisk None         Visual Fields (Counting fingers)       Left Right    Full Full         Extraocular Movement       Right Left    Full, Ortho Full, Ortho         Neuro/Psych     Oriented x3: Yes   Mood/Affect: Normal         Dilation     Both eyes: 1.0% Mydriacyl, 2.5% Phenylephrine @ 2:15 PM           Slit Lamp and Fundus Exam     Slit Lamp Exam       Right Left   Lids/Lashes Dermatochalasis - upper lid, mild MGD Dermatochalasis - upper lid, mild MGD   Conjunctiva/Sclera Trace Injection Trace Injection   Cornea arcus, well healed temporal cataract wounds, 1-2+ inferior Punctate epithelial erosions, mild tear film debris arcus, well healed temporal cataract wounds, trace PEE, tear film debris, trace EBMD   Anterior Chamber deep and clear deep and clear   Iris Round and dilated Round and dilated   Lens PC IOL in good position PC IOL in good position   Anterior Vitreous clear Mild Asteroid hyalosis, Vitreous syneresis         Fundus Exam       Right Left   Disc Pink and Sharp, +cupping Pink and Sharp   C/D Ratio 0.65 0.5   Macula Flat, Blunted foveal reflex, mild RPE mottling, No heme or edema Flat, hazy view, Blunted foveal reflex, small lamellar hole, cystic  changes centrally -- slightly improved, no heme   Vessels attenuated, mild tortuosity attenuated, mild tortuosity   Periphery Attached, reticular degeneration, peripheral drusen, RPE atrophy nasal periphery, No heme Attached, blot hemes temporal periphery, reticular degeneration, peripheral drusen, hypopigmented CR scar at 0900, No heme            IMAGING AND PROCEDURES  Imaging and Procedures for 09/06/2021  OCT, Retina - OU - Both Eyes       Right Eye Quality was good. Central Foveal Thickness: 266. Progression has been stable. Findings include normal foveal contour, no IRF, no SRF, vitreomacular adhesion .   Left Eye Quality was good. Central Foveal Thickness: 353. Progression has improved. Findings include no SRF, abnormal foveal contour, epiretinal membrane, intraretinal fluid, lamellar hole, macular pucker, preretinal fibrosis (Mild interval improvement in perifoveal cystic changes, ERM with central lamellar hole -- stable).   Notes *Images captured and stored on drive  Diagnosis / Impression:  OD: NFP, no IRF/SRF OS: stable ERM with central lamellar hole; mild interval improvement in perifoveal cystic changes  Clinical management:  See below  Abbreviations: NFP - Normal foveal profile. CME - cystoid macular edema. PED - pigment epithelial detachment. IRF - intraretinal fluid. SRF - subretinal fluid. EZ - ellipsoid zone. ERM - epiretinal membrane. ORA - outer retinal atrophy. ORT - outer retinal tubulation. SRHM - subretinal hyper-reflective material. IRHM - intraretinal hyper-reflective material            ASSESSMENT/PLAN:    ICD-10-CM   1. Epiretinal membrane (ERM) of left eye  H35.372 OCT, Retina - OU - Both Eyes    2. 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 Essential hypertension  I10     6. Hypertensive retinopathy of both eyes  H35.033     7.  Pseudophakia of both eyes  Z96.1       1-3. Epiretinal membrane with lamellar hole and CME OS - mild ERM OS w/ central lamellar hole and surrounding cystic changes -- ?post op CME component - BCVA 20/40 -- stable - OCT shows stable ERM with central lamellar hole and mild interval improvement in perifoveal cystic changes, persistent PRF / partial PVD overlying disc on PF qdaily OS, and prolensa BID OS - FA 5.24.22 shows no leakage or CME - IOP 10,18 today on Cosopt bid OS (history of steroid reponse) - no indication for ERM surgery at this time - differential diagnosis for central cystic changes includes foveoschisis - continue PF Qdaily OS and keep Prolensa at BID OS -- treat post op CME component - continue Cosopt BID OS  - f/u 3-4 months -- DFE/OCT  4. Asteroid hyalosis OS  - discussed diagnosis, prognosis and benign nature  - monitor  5,6. Hypertensive retinopathy OU - discussed importance of tight BP control - monitor  7. Pseudophakia OU  - s/p CE/IOL OU (Dr. Valetta Close)  - IOLs in good position - monitor  Ophthalmic Meds Ordered this visit:  No orders of the defined types were placed in this encounter.     Return for f/u 3-4 months, ERM with CME OS, DFE, OCT.  There are no Patient Instructions on file for this visit.  This document serves as a record of services personally performed by Gardiner Sleeper, MD, PhD. It was created on their behalf by Renaldo Reel, COT an ophthalmic  technician. The creation of this record is the provider's dictation and/or activities during the visit.    Electronically signed by:  Renaldo Reel, COT  08/24/21 1:38 AM   This document serves as a record of services personally performed by Gardiner Sleeper, MD, PhD. It was created on their behalf by San Jetty. Owens Shark, OA an ophthalmic technician. The creation of this record is the provider's dictation and/or activities during the visit.    Electronically signed by: San Jetty. Owens Shark, [place]  08.22.2023 1:38 AM  Gardiner Sleeper, M.D., Ph.D. Diseases & Surgery of the Retina and Vitreous [facility]  I have reviewed the above documentation for accuracy and completeness, and I agree with the above. Gardiner Sleeper, M.D., Ph.D. 09/07/21 1:38 AM  Abbreviations: M myopia (nearsighted); A astigmatism; H hyperopia (farsighted); P presbyopia; Mrx spectacle prescription;  CTL contact lenses; OD right eye; OS left eye; OU both eyes  XT exotropia; ET esotropia; PEK punctate epithelial keratitis; PEE punctate epithelial erosions; DES dry eye syndrome; MGD meibomian gland dysfunction; ATs artificial tears; PFAT's preservative free artificial tears; [place] nuclear sclerotic cataract; PSC posterior subcapsular cataract; ERM epi-retinal membrane; PVD posterior vitreous detachment; RD retinal detachment; DM diabetes mellitus; DR diabetic retinopathy; NPDR non-proliferative diabetic retinopathy; PDR proliferative diabetic retinopathy; CSME clinically significant macular edema; DME diabetic macular edema; dbh dot blot hemorrhages; CWS cotton wool spot; POAG primary open angle glaucoma; C/D cup-to-disc ratio; HVF humphrey visual field; GVF goldmann visual field; OCT optical coherence tomography; IOP intraocular pressure; BRVO Branch retinal vein occlusion; CRVO central retinal vein occlusion; CRAO central retinal artery occlusion; BRAO branch retinal artery occlusion; RT retinal tear; SB scleral buckle; PPV pars plana vitrectomy; VH Vitreous hemorrhage; PRP panretinal laser photocoagulation; IVK intravitreal kenalog; VMT vitreomacular traction; MH Macular hole;  NVD neovascularization of the disc; NVE neovascularization elsewhere; AREDS age related eye disease study; ARMD age related macular degeneration; POAG primary open angle glaucoma; EBMD epithelial/anterior basement membrane dystrophy; ACIOL anterior chamber intraocular lens; IOL intraocular lens; PCIOL posterior chamber intraocular lens;  Phaco/IOL phacoemulsification with intraocular lens placement; [place] photorefractive keratectomy; LASIK laser assisted in situ keratomileusis; HTN hypertension; DM diabetes mellitus; COPD chronic obstructive pulmonary disease

## 2021-08-29 ENCOUNTER — Encounter (INDEPENDENT_AMBULATORY_CARE_PROVIDER_SITE_OTHER): Payer: Medicare Other | Admitting: Ophthalmology

## 2021-09-06 ENCOUNTER — Ambulatory Visit (INDEPENDENT_AMBULATORY_CARE_PROVIDER_SITE_OTHER): Payer: Medicare Other | Admitting: Ophthalmology

## 2021-09-06 ENCOUNTER — Encounter (INDEPENDENT_AMBULATORY_CARE_PROVIDER_SITE_OTHER): Payer: Self-pay | Admitting: Ophthalmology

## 2021-09-06 DIAGNOSIS — Z961 Presence of intraocular lens: Secondary | ICD-10-CM | POA: Diagnosis not present

## 2021-09-06 DIAGNOSIS — H35033 Hypertensive retinopathy, bilateral: Secondary | ICD-10-CM

## 2021-09-06 DIAGNOSIS — H4322 Crystalline deposits in vitreous body, left eye: Secondary | ICD-10-CM | POA: Diagnosis not present

## 2021-09-06 DIAGNOSIS — I10 Essential (primary) hypertension: Secondary | ICD-10-CM | POA: Diagnosis not present

## 2021-09-06 DIAGNOSIS — H35342 Macular cyst, hole, or pseudohole, left eye: Secondary | ICD-10-CM

## 2021-09-06 DIAGNOSIS — H35372 Puckering of macula, left eye: Secondary | ICD-10-CM

## 2021-09-06 DIAGNOSIS — H35352 Cystoid macular degeneration, left eye: Secondary | ICD-10-CM

## 2021-09-07 ENCOUNTER — Encounter (INDEPENDENT_AMBULATORY_CARE_PROVIDER_SITE_OTHER): Payer: Self-pay | Admitting: Ophthalmology

## 2021-09-22 DIAGNOSIS — M25572 Pain in left ankle and joints of left foot: Secondary | ICD-10-CM | POA: Diagnosis not present

## 2021-10-19 DIAGNOSIS — Z23 Encounter for immunization: Secondary | ICD-10-CM | POA: Diagnosis not present

## 2021-11-08 DIAGNOSIS — L821 Other seborrheic keratosis: Secondary | ICD-10-CM | POA: Diagnosis not present

## 2021-11-08 DIAGNOSIS — L820 Inflamed seborrheic keratosis: Secondary | ICD-10-CM | POA: Diagnosis not present

## 2021-11-08 DIAGNOSIS — L570 Actinic keratosis: Secondary | ICD-10-CM | POA: Diagnosis not present

## 2021-11-16 DIAGNOSIS — C61 Malignant neoplasm of prostate: Secondary | ICD-10-CM | POA: Diagnosis not present

## 2021-11-22 DIAGNOSIS — R3915 Urgency of urination: Secondary | ICD-10-CM | POA: Diagnosis not present

## 2021-11-22 DIAGNOSIS — N401 Enlarged prostate with lower urinary tract symptoms: Secondary | ICD-10-CM | POA: Diagnosis not present

## 2021-11-22 DIAGNOSIS — C61 Malignant neoplasm of prostate: Secondary | ICD-10-CM | POA: Diagnosis not present

## 2022-01-04 DIAGNOSIS — U071 COVID-19: Secondary | ICD-10-CM | POA: Diagnosis not present

## 2022-01-12 NOTE — Progress Notes (Signed)
[facility] Note  01/24/2022     CHIEF COMPLAINT Patient presents for Retina Follow Up   HISTORY OF PRESENT ILLNESS: Jesse Goodwin is a 76 y.o. male who presents to the clinic today for:  HPI     Retina Follow Up   Patient presents with  Other.  In left eye.  This started 4.5 months ago.  I, the attending physician,  performed the HPI with the patient and updated documentation appropriately.        Comments   Patient here for 4.5 months retina follow up for ERM w/ CME OS. Patient states vision seems about the same. No eye pain. Taking Prolensa BID OS, Red top QD OS and Blue top BID OS. used gtts this am.      Last edited by Bernarda Caffey, MD on 01/24/2022  4:37 PM.    Pt states occasionally when he is outside in the sun, he will get a "mottled grey" film over his eye that will impede his vision for 10-20 mins, this has been happening for 4-6 weeks, he is trying to use AT's more often  Referring physician: Cari Caraway, MD [place],  [place] [id number]  HISTORICAL INFORMATION:   Selected notes from the MEDICAL RECORD NUMBER Referred by Dr. 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   Bromfenac Sodium (PROLENSA) 0.07 % SOLN Place 1 drop into the left eye 2 (two) times daily.   dorzolamide-timolol (COSOPT) 22.3-6.8 MG/ML ophthalmic solution Place 1 drop into the left eye 2 (two) times daily.   Tetrahydrozoline HCl (VISINE OP) Place 2 drops into both eyes daily as needed (dry eye).   prednisoLONE acetate (PRED FORTE) 1 % ophthalmic suspension Place 1 drop into the left eye daily. (Patient not taking: Reported on 01/24/2022)   No current facility-administered medications for this visit. (Ophthalmic Drugs)   Current Outpatient Medications (Other)  Medication Sig    amLODipine (NORVASC) 10 MG tablet Take 10 mg by mouth daily.   aspirin 325 MG EC tablet Take 325 mg by mouth daily.   azelastine (ASTELIN) 0.1 % nasal spray    losartan (COZAAR) 100 MG tablet    Nirmatrelvir & Ritonavir (PAXLOVID) 20 x 150 MG & 10 x '100MG'$  TBPK See admin instructions.   simvastatin (ZOCOR) 20 MG tablet Take 20 mg by mouth at bedtime.   Triamcinolone Acetonide (NASACORT ALLERGY 24HR NA) Place into the nose daily. 1 spray each nostril   Triamcinolone Acetonide (NASACORT AQ NA)    No current facility-administered medications for this visit. (Other)   REVIEW OF SYSTEMS: ROS   Positive for: Neurological, Genitourinary, Eyes Negative for: Constitutional, Gastrointestinal, Skin, Musculoskeletal, HENT, Endocrine, Cardiovascular, Respiratory, Psychiatric, Allergic/Imm, Heme/Lymph Last edited by Theodore Demark, COA on 01/24/2022  2:09 PM.     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    Past Surgical History:  Procedure Laterality Date   CAROTID ENDARTERECTOMY     2014 FOR TREATMENT OF STROKE  CATARACT EXTRACTION     CHOLECYSTECTOMY     living in [place] at this time, does not remember when or who did it    EYE SURGERY     PROSTATE BIOPSY  2019   RADIOACTIVE SEED IMPLANT N/A 04/30/2017   Procedure: RADIOACTIVE SEED IMPLANT/BRACHYTHERAPY IMPLANT;  Surgeon: Cleon Gustin, MD;  Location: [facility];  Service: Urology;  Laterality: N/A;   SPACE OAR INSTILLATION N/A 04/30/2017   Procedure: SPACE OAR INSTILLATION;  Surgeon: Cleon Gustin, MD;  Location: [facility];  Service: Urology;  Laterality: N/A;   FAMILY HISTORY Family History  Problem Relation Age of Onset   Stroke  Mother    Breast cancer Mother    Dementia Mother    CAD Mother    Diabetes Mellitus II Mother    Cancer Mother        breast   Diabetes Mother    Heart disease Mother    Hyperlipidemia Mother    Hypertension Mother    Hyperlipidemia Brother    Hyperlipidemia Son    Stroke Sister    SOCIAL HISTORY Social History   Tobacco Use   Smoking status: Never   Smokeless tobacco: Never  Vaping Use   Vaping Use: Never used  Substance Use Topics   Alcohol use: Yes    Alcohol/week: 14.0 standard drinks of alcohol    Types: 7 Glasses of wine, 7 Shots of liquor per week    Comment: drinks mixed drinks and wine in the night.   Drug use: No       OPHTHALMIC EXAM: Base Eye Exam     Visual Acuity (Snellen - Linear)       Right Left   Dist cc 20/20 20/40 -1   Dist ph cc  NI    Correction: Glasses         Tonometry (Tonopen, 2:04 PM)       Right Left   Pressure 12 28, 27         Pupils       Dark Light Shape React APD   Right 3 2 Round Brisk None   Left 4 3 Round Minimal None         Visual Fields (Counting fingers)       Left Right    Full Full         Extraocular Movement       Right Left    Full, Ortho Full, Ortho         Neuro/Psych     Oriented x3: Yes   Mood/Affect: Normal         Dilation     Both eyes: 1.0% Mydriacyl, 2.5% Phenylephrine @ 2:04 PM           Slit Lamp and Fundus Exam     Slit Lamp Exam       Right Left   Lids/Lashes Dermatochalasis - upper lid, MGD, marginal papilloma UL Dermatochalasis - upper lid, Meibomian gland dysfunction   Conjunctiva/Sclera White and quiet White and quiet   Cornea arcus, well healed temporal cataract wounds, trace Punctate epithelial erosions, mild tear film debris, trace EBMD arcus, well healed temporal cataract wounds, trace PEE, tear film debris, trace EBMD   Anterior Chamber deep and clear deep and clear   Iris Round and dilated Round and dilated   Lens PC IOL in good position PC  IOL in good position   Anterior Vitreous clear Mild Asteroid hyalosis, Vitreous  syneresis         Fundus Exam       Right Left   Disc Pink and Sharp, +cupping Pink and Sharp   C/D Ratio 0.65 0.5   Macula Flat, Blunted foveal reflex, mild RPE mottling, No heme or edema Flat, hazy view, Blunted foveal reflex, small lamellar hole, cystic changes centrally -- slightly improved, no heme   Vessels attenuated, Tortuous mild attenuation, mild tortuosity   Periphery Attached, reticular degeneration, peripheral drusen, RPE atrophy nasal periphery, No heme Attached, blot hemes temporal periphery, reticular degeneration, peripheral drusen, hypopigmented CR scar at 0900           Refraction     Wearing Rx       Sphere Cylinder Axis Add   Right -0.75 Sphere  +2.50   Left [place] +1.00 140 2.50            IMAGING AND PROCEDURES  Imaging and Procedures for 01/24/2022  OCT, Retina - OU - Both Eyes       Right Eye Quality was good. Central Foveal Thickness: 264. Progression has been stable. Findings include normal foveal contour, no IRF, no SRF, vitreomacular adhesion .   Left Eye Quality was good. Central Foveal Thickness: 340. Progression has been stable. Findings include no SRF, abnormal foveal contour, epiretinal membrane, intraretinal fluid, lamellar hole, macular pucker, preretinal fibrosis (Mild persistent perifoveal cystic changes, ERM with central lamellar hole -- stable).   Notes *Images captured and stored on drive  Diagnosis / Impression:  OD: NFP, no IRF/SRF OS: Mild persistent perifoveal cystic changes, ERM with central lamellar hole -- stable  Clinical management:  See below  Abbreviations: NFP - Normal foveal profile. CME - cystoid macular edema. PED - pigment epithelial detachment. IRF - intraretinal fluid. SRF - subretinal fluid. EZ - ellipsoid zone. ERM - epiretinal membrane. ORA - outer retinal atrophy. ORT - outer retinal tubulation. SRHM - subretinal  hyper-reflective material. IRHM - intraretinal hyper-reflective material            ASSESSMENT/PLAN:    ICD-10-CM   1. Epiretinal membrane (ERM) of left eye  H35.372 OCT, Retina - OU - Both Eyes    2. 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 Essential hypertension  I10     6. Hypertensive retinopathy of both eyes  H35.033     7. Pseudophakia of both eyes  Z96.1      1-3. Epiretinal membrane with lamellar hole and CME OS - mild ERM OS w/ central lamellar hole and surrounding cystic changes -- ?post op CME component - BCVA 20/40 -- stable - OCT shows mild persistent perifoveal cystic changes, ERM with central lamellar hole -- stable - FA 5.24.22 shows no leakage or CME - IOP 12, 28/27 today on Cosopt bid OS (history of steroid reponse) - no indication for ERM surgery at this time - differential diagnosis for central cystic changes includes foveoschisis - continue PF Qdaily OS -- okay to stop due to steroid response - continue Prolensa at BID OS - continue Cosopt BID OS  - start brimonidine BID OS - f/u 4-6 weeks -- DFE/OCT  4. Asteroid hyalosis OS  - discussed diagnosis, prognosis and benign nature  - monitor  5,6. Hypertensive retinopathy OU - discussed importance of tight BP control - monitor  7. Pseudophakia OU  -  s/p CE/IOL OU (Dr. Valetta Close)  - IOLs in good position - monitor  Ophthalmic Meds Ordered this visit:  Meds ordered this encounter  Medications   brimonidine (ALPHAGAN) 0.2 % ophthalmic solution    Sig: Place 1 drop into the right eye 2 (two) times daily.    Dispense:  10 mL    Refill:  1      Return for f/u 4-6 weeks, IOP check, ERM OS, DFE, OCT.  There are no Patient Instructions on file for this visit.  This document serves as a record of services personally performed by Gardiner Sleeper, MD, PhD. It was created on their behalf by Renaldo Reel, [place] an ophthalmic technician. The creation of this record is the provider's dictation and/or activities during the visit.    Electronically signed by:  Renaldo Reel, COT  12.28.23 4:38 PM   This document serves as a record of services personally performed by Gardiner Sleeper, MD, PhD. It was created on their behalf by San Jetty. Owens Shark, OA an ophthalmic technician. The creation of this record is the provider's dictation and/or activities during the visit.    Electronically signed by: San Jetty. Marguerita Merles 01.09.2024 4:38 PM  Gardiner Sleeper, M.D., Ph.D. Diseases & Surgery of the Retina and Vitreous [facility]  I have reviewed the above documentation for accuracy and completeness, and I agree with the above. Gardiner Sleeper, M.D., Ph.D. 01/24/22 4:39 PM   Abbreviations: M myopia (nearsighted); A astigmatism; H hyperopia (farsighted); P presbyopia; Mrx spectacle prescription;  CTL contact lenses; OD right eye; OS left eye; OU both eyes  XT exotropia; ET esotropia; PEK punctate epithelial keratitis; PEE punctate epithelial erosions; DES dry eye syndrome; MGD meibomian gland dysfunction; ATs artificial tears; PFAT's preservative free artificial tears; [place] nuclear sclerotic cataract; PSC posterior subcapsular cataract; ERM epi-retinal membrane; PVD posterior vitreous detachment; RD retinal detachment; DM diabetes mellitus; DR diabetic retinopathy; NPDR non-proliferative diabetic retinopathy; PDR proliferative diabetic retinopathy; CSME clinically significant macular edema; DME diabetic macular edema; dbh dot blot hemorrhages; CWS cotton wool spot; POAG primary open angle glaucoma; C/D cup-to-disc ratio; HVF humphrey visual field; GVF goldmann visual field; OCT optical coherence tomography; IOP intraocular pressure; BRVO Branch retinal vein occlusion; CRVO central retinal vein occlusion; CRAO central retinal artery occlusion; BRAO branch retinal artery occlusion; RT  retinal tear; SB scleral buckle; PPV pars plana vitrectomy; VH Vitreous hemorrhage; PRP panretinal laser photocoagulation; IVK intravitreal kenalog; VMT vitreomacular traction; MH Macular hole;  NVD neovascularization of the disc; NVE neovascularization elsewhere; AREDS age related eye disease study; ARMD age related macular degeneration; POAG primary open angle glaucoma; EBMD epithelial/anterior basement membrane dystrophy; ACIOL anterior chamber intraocular lens; IOL intraocular lens; PCIOL posterior chamber intraocular lens; Phaco/IOL phacoemulsification with intraocular lens placement; [place] photorefractive keratectomy; LASIK laser assisted in situ keratomileusis; HTN hypertension; DM diabetes mellitus; COPD chronic obstructive pulmonary disease

## 2022-01-23 DIAGNOSIS — D378 Neoplasm of uncertain behavior of other specified digestive organs: Secondary | ICD-10-CM | POA: Diagnosis not present

## 2022-01-23 DIAGNOSIS — K869 Disease of pancreas, unspecified: Secondary | ICD-10-CM | POA: Diagnosis not present

## 2022-01-23 DIAGNOSIS — Z8546 Personal history of malignant neoplasm of prostate: Secondary | ICD-10-CM | POA: Diagnosis not present

## 2022-01-23 DIAGNOSIS — D3A8 Other benign neuroendocrine tumors: Secondary | ICD-10-CM | POA: Diagnosis not present

## 2022-01-23 DIAGNOSIS — R7989 Other specified abnormal findings of blood chemistry: Secondary | ICD-10-CM | POA: Diagnosis not present

## 2022-01-24 ENCOUNTER — Encounter (INDEPENDENT_AMBULATORY_CARE_PROVIDER_SITE_OTHER): Payer: Self-pay | Admitting: Ophthalmology

## 2022-01-24 ENCOUNTER — Ambulatory Visit (INDEPENDENT_AMBULATORY_CARE_PROVIDER_SITE_OTHER): Payer: Medicare Other | Admitting: Ophthalmology

## 2022-01-24 DIAGNOSIS — I10 Essential (primary) hypertension: Secondary | ICD-10-CM

## 2022-01-24 DIAGNOSIS — Z961 Presence of intraocular lens: Secondary | ICD-10-CM | POA: Diagnosis not present

## 2022-01-24 DIAGNOSIS — H35372 Puckering of macula, left eye: Secondary | ICD-10-CM

## 2022-01-24 DIAGNOSIS — H35033 Hypertensive retinopathy, bilateral: Secondary | ICD-10-CM | POA: Diagnosis not present

## 2022-01-24 DIAGNOSIS — H4322 Crystalline deposits in vitreous body, left eye: Secondary | ICD-10-CM | POA: Diagnosis not present

## 2022-01-24 DIAGNOSIS — H35342 Macular cyst, hole, or pseudohole, left eye: Secondary | ICD-10-CM

## 2022-01-24 DIAGNOSIS — H35352 Cystoid macular degeneration, left eye: Secondary | ICD-10-CM | POA: Diagnosis not present

## 2022-01-24 MED ORDER — BRIMONIDINE TARTRATE 0.2 % OP SOLN: 1.0000 [drp] | Freq: Two times a day (BID) | OPHTHALMIC | 1 refills | Status: DC

## 2022-02-14 NOTE — Progress Notes (Shared)
[facility] Note  02/28/2022     CHIEF COMPLAINT Patient presents for No chief complaint on file.   HISTORY OF PRESENT ILLNESS: Jesse Goodwin is a 77 y.o. male who presents to the clinic today for:   Pt states occasionally when he is outside in the sun, he will get a "mottled grey" film over his eye that will impede his vision for 10-20 mins, this has been happening for 4-6 weeks, he is trying to use AT's more often  Referring physician: Cari Caraway, MD [place],   [id number]  HISTORICAL INFORMATION:   Selected notes from the [place] Referred by Dr. 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   Bromfenac Sodium (PROLENSA) 0.07 % SOLN Place 1 drop into the left eye 2 (two) times daily.   dorzolamide-timolol (COSOPT) 22.3-6.8 MG/ML ophthalmic solution Place 1 drop into the left eye 2 (two) times daily.   prednisoLONE acetate (PRED FORTE) 1 % ophthalmic suspension Place 1 drop into the left eye daily. (Patient not taking: Reported on 01/24/2022)   Tetrahydrozoline HCl (VISINE OP) Place 2 drops into both eyes daily as needed (dry eye).   No current facility-administered medications for this visit. (Ophthalmic Drugs)   Current Outpatient Medications (Other)  Medication Sig   amLODipine (NORVASC) 10 MG tablet Take 10 mg by mouth daily.   aspirin 325 MG EC tablet Take 325 mg by mouth daily.   azelastine (ASTELIN) 0.1 % nasal spray    losartan (COZAAR) 100 MG tablet    Nirmatrelvir & Ritonavir (PAXLOVID) 20 x 150 MG & 10 x '100MG'$  TBPK See admin instructions.   simvastatin (ZOCOR) 20 MG tablet Take 20 mg by mouth at bedtime.   Triamcinolone Acetonide (NASACORT ALLERGY 24HR NA) Place into the nose daily. 1 spray each nostril   Triamcinolone Acetonide  (NASACORT AQ NA)    No current facility-administered medications for this visit. 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;  Service: Urology;  Laterality: N/A;   FAMILY HISTORY Family History  Problem Relation Age of Onset   Stroke Mother    Breast cancer Mother    Dementia Mother    CAD Mother    Diabetes Mellitus II Mother  Cancer Mother        breast   Diabetes Mother    Heart disease Mother    Hyperlipidemia Mother    Hypertension Mother    Hyperlipidemia Brother    Hyperlipidemia Son    Stroke Sister    SOCIAL HISTORY Social History   Tobacco Use   Smoking status: Never   Smokeless tobacco: Never  Vaping Use   Vaping Use: Never used  Substance Use Topics   Alcohol use: Yes    Alcohol/week: 14.0 standard drinks of alcohol    Types: 7 Glasses of wine, 7 Shots of liquor per week    Comment: drinks mixed drinks and wine in the night.   Drug use: No       OPHTHALMIC EXAM: Not recorded     IMAGING AND PROCEDURES  Imaging and Procedures for  02/28/2022          ASSESSMENT/PLAN:    ICD-10-CM   1. Epiretinal membrane (ERM) of left eye  H35.372     2. 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 Essential hypertension  I10     6. Hypertensive retinopathy of both eyes  H35.033     7. Pseudophakia of both eyes  Z96.1       1-3. Epiretinal membrane with lamellar hole and CME OS - mild ERM OS w/ central lamellar hole and surrounding cystic changes -- ?post op CME component - BCVA 20/40 -- stable - OCT shows mild persistent perifoveal cystic changes, ERM with central lamellar hole -- stable - FA 5.24.22 shows no leakage or CME - IOP 12, 28/27 today on Cosopt bid OS (history of steroid reponse) - no indication for ERM surgery at this time - differential diagnosis for central cystic changes includes foveoschisis - continue PF Qdaily OS -- okay to stop due to steroid response - continue Prolensa at BID OS - continue Cosopt BID OS  - start brimonidine BID OS - f/u 4-6 weeks -- DFE/OCT  4. Asteroid hyalosis OS  - discussed diagnosis, prognosis and benign nature  - continue to monitor  5,6. Hypertensive retinopathy OU - discussed importance of tight BP control -continue to monitor  7. Pseudophakia OU  - s/p CE/IOL OU (Dr. Valetta Close)  - IOLs in good position - continue to monitor  Ophthalmic Meds Ordered this visit:  No orders of the defined types were placed in this encounter.     No follow-ups on file.  There are no Patient Instructions on file for this visit.  This document serves as a record of services personally performed by Gardiner Sleeper, MD, PhD. It was created on their behalf by Renaldo Reel, [place] an ophthalmic technician. The creation of this record is the provider's dictation and/or activities during the visit.    Electronically signed by:  Renaldo Reel, COT  1.30.24 8:02 AM     Gardiner Sleeper, M.D., Ph.D. Diseases & Surgery of the Retina and Vitreous [facility]: M myopia (nearsighted); A astigmatism; H hyperopia (farsighted); P presbyopia; Mrx spectacle prescription;  CTL contact lenses; OD right eye; OS left eye; OU both eyes  XT exotropia; ET esotropia; PEK punctate epithelial keratitis; PEE punctate epithelial erosions; DES dry eye syndrome; MGD meibomian gland dysfunction; ATs artificial tears; PFAT's preservative free artificial tears; [place]  nuclear sclerotic cataract; PSC posterior subcapsular cataract; ERM epi-retinal membrane; PVD posterior vitreous detachment; RD retinal detachment; DM diabetes mellitus; DR diabetic retinopathy; NPDR non-proliferative diabetic retinopathy; PDR proliferative diabetic retinopathy; CSME clinically significant macular edema; DME diabetic macular edema; dbh dot blot hemorrhages; CWS cotton wool spot; POAG primary open angle glaucoma; C/D cup-to-disc ratio; HVF humphrey visual field; GVF goldmann visual field; OCT optical coherence tomography; IOP intraocular pressure; BRVO Branch retinal vein occlusion; CRVO central retinal vein occlusion; CRAO central retinal artery occlusion; BRAO branch retinal artery occlusion; RT retinal tear; SB scleral buckle; PPV pars plana vitrectomy; VH Vitreous hemorrhage; PRP panretinal laser photocoagulation; IVK intravitreal kenalog; VMT vitreomacular traction; MH Macular hole;  NVD neovascularization of the disc; NVE neovascularization elsewhere; AREDS age related eye disease study; ARMD age related macular degeneration; POAG primary open angle glaucoma; EBMD epithelial/anterior basement membrane dystrophy; ACIOL anterior chamber intraocular lens; IOL intraocular lens; PCIOL posterior chamber intraocular lens; Phaco/IOL phacoemulsification with intraocular lens placement; [place] photorefractive keratectomy; LASIK laser assisted in situ keratomileusis; HTN hypertension; DM diabetes  mellitus; COPD chronic obstructive pulmonary disease

## 2022-02-27 ENCOUNTER — Other Ambulatory Visit (INDEPENDENT_AMBULATORY_CARE_PROVIDER_SITE_OTHER): Payer: Self-pay | Admitting: Ophthalmology

## 2022-02-28 ENCOUNTER — Encounter (INDEPENDENT_AMBULATORY_CARE_PROVIDER_SITE_OTHER): Payer: Medicare Other | Admitting: Ophthalmology

## 2022-02-28 ENCOUNTER — Encounter (INDEPENDENT_AMBULATORY_CARE_PROVIDER_SITE_OTHER): Payer: Self-pay | Admitting: Ophthalmology

## 2022-02-28 ENCOUNTER — Other Ambulatory Visit (INDEPENDENT_AMBULATORY_CARE_PROVIDER_SITE_OTHER): Payer: Self-pay

## 2022-02-28 ENCOUNTER — Ambulatory Visit (INDEPENDENT_AMBULATORY_CARE_PROVIDER_SITE_OTHER): Payer: Medicare Other | Admitting: Ophthalmology

## 2022-02-28 DIAGNOSIS — H35372 Puckering of macula, left eye: Secondary | ICD-10-CM | POA: Diagnosis not present

## 2022-02-28 DIAGNOSIS — H35342 Macular cyst, hole, or pseudohole, left eye: Secondary | ICD-10-CM

## 2022-02-28 DIAGNOSIS — H35352 Cystoid macular degeneration, left eye: Secondary | ICD-10-CM

## 2022-02-28 DIAGNOSIS — H35033 Hypertensive retinopathy, bilateral: Secondary | ICD-10-CM

## 2022-02-28 DIAGNOSIS — Z961 Presence of intraocular lens: Secondary | ICD-10-CM

## 2022-02-28 DIAGNOSIS — I10 Essential (primary) hypertension: Secondary | ICD-10-CM

## 2022-02-28 DIAGNOSIS — H4322 Crystalline deposits in vitreous body, left eye: Secondary | ICD-10-CM

## 2022-02-28 MED ORDER — BROMFENAC SODIUM 0.07 % OP SOLN: 1.0000 [drp] | Freq: Two times a day (BID) | OPHTHALMIC | 5 refills | Status: DC

## 2022-02-28 NOTE — Progress Notes (Signed)
[facility] Note  02/28/2022     CHIEF COMPLAINT Patient presents for Retina Follow Up   HISTORY OF PRESENT ILLNESS: Jesse Goodwin is a 77 y.o. male who presents to the clinic today for:  HPI     Retina Follow Up   Patient presents with  Other.  In left eye.  Severity is moderate.  Duration of 5 weeks.  Since onset it is stable.  I, the attending physician,  performed the HPI with the patient and updated documentation appropriately.        Comments   Pt here for 5 wk ret f/u for ERM OS. Pt states VA the same, no changes noted.      Last edited by Bernarda Caffey, MD on 03/02/2022  2:07 AM.    Pt states he has not having any problems with his vision  Referring physician: Cari Caraway, MD [place],  [place] [address]  HISTORICAL INFORMATION:   Selected notes from the MEDICAL RECORD NUMBER Referred by Dr. 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   Bromfenac Sodium (PROLENSA) 0.07 % SOLN Place 1 drop into the left eye 2 (two) times daily.   dorzolamide-timolol (COSOPT) 22.3-6.8 MG/ML ophthalmic solution Place 1 drop into the left eye 2 (two) times daily.   Tetrahydrozoline HCl (VISINE OP) Place 2 drops into both eyes daily as needed (dry eye).   prednisoLONE acetate (PRED FORTE) 1 % ophthalmic suspension Place 1 drop into the left eye daily. (Patient not taking: Reported on 01/24/2022)   No current facility-administered medications for this visit. (Ophthalmic Drugs)   Current Outpatient Medications (Other)  Medication Sig   amLODipine (NORVASC) 10 MG tablet Take 10 mg by mouth daily.   aspirin 325 MG EC tablet Take 325 mg by mouth daily.   azelastine (ASTELIN) 0.1 % nasal spray    losartan (COZAAR) 100 MG tablet    Nirmatrelvir & Ritonavir (PAXLOVID) 20  x 150 MG & 10 x 100MG TBPK See admin instructions.   simvastatin (ZOCOR) 20 MG tablet Take 20 mg by mouth at bedtime.   Triamcinolone Acetonide (NASACORT ALLERGY 24HR NA) Place into the nose daily. 1 spray each nostril   Triamcinolone Acetonide (NASACORT AQ NA)    No current facility-administered medications for this visit. (Other)   REVIEW OF SYSTEMS: ROS   Positive for: Neurological, Genitourinary, Eyes Negative for: Constitutional, Gastrointestinal, Skin, Musculoskeletal, HENT, Endocrine, Cardiovascular, Respiratory, Psychiatric, Allergic/Imm, Heme/Lymph Last edited by Kingsley Spittle, COT on 02/28/2022  2:31 PM.     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    Past Surgical History:  Procedure Laterality Date   CAROTID ENDARTERECTOMY     2014 FOR TREATMENT OF STROKE    CATARACT EXTRACTION     CHOLECYSTECTOMY     living in [place] at this time, does not remember when or who did it    EYE SURGERY     PROSTATE BIOPSY  2019   RADIOACTIVE SEED IMPLANT  N/A 04/30/2017   Procedure: RADIOACTIVE SEED IMPLANT/BRACHYTHERAPY IMPLANT;  Surgeon: Cleon Gustin, MD;  Location[facility];  Service: Urology;  Laterality: N/A;   SPACE OAR INSTILLATION N/A 04/30/2017   Procedure: SPACE OAR INSTILLATION;  Surgeon: Cleon Gustin, MD;  Location: [facility];  Service: Urology;  Laterality: N/A;   FAMILY HISTORY Family History  Problem Relation Age of Onset   Stroke Mother    Breast cancer Mother    Dementia Mother    CAD Mother    Diabetes Mellitus II Mother    Cancer Mother        breast   Diabetes Mother    Heart disease Mother    Hyperlipidemia Mother    Hypertension Mother     Hyperlipidemia Brother    Hyperlipidemia Son    Stroke Sister    SOCIAL HISTORY Social History   Tobacco Use   Smoking status: Never   Smokeless tobacco: Never  Vaping Use   Vaping Use: Never used  Substance Use Topics   Alcohol use: Yes    Alcohol/week: 14.0 standard drinks of alcohol    Types: 7 Glasses of wine, 7 Shots of liquor per week    Comment: drinks mixed drinks and wine in the night.   Drug use: No       OPHTHALMIC EXAM: Base Eye Exam     Visual Acuity (Snellen - Linear)       Right Left   Dist cc 20/25 20/40 +2   Dist ph cc NI NI    Correction: Glasses         Tonometry (Tonopen, 2:36 PM)       Right Left   Pressure 12 19         Pupils       Dark Light Shape React APD   Right 3 2 Round Brisk None   Left 4 3 Round Minimal None         Visual Fields (Counting fingers)       Left Right    Full Full         Extraocular Movement       Right Left    Full, Ortho Full, Ortho         Neuro/Psych     Oriented x3: Yes   Mood/Affect: Normal         Dilation     Both eyes: 1.0% Mydriacyl, 2.5% Phenylephrine @ 2:36 PM           Slit Lamp and Fundus Exam     Slit Lamp Exam       Right Left   Lids/Lashes Dermatochalasis - upper lid, MGD, marginal papilloma UL Dermatochalasis - upper lid, Meibomian gland dysfunction   Conjunctiva/Sclera White and quiet White and quiet   Cornea arcus, well healed temporal cataract wounds, mild tear film debris, trace EBMD arcus, well healed temporal cataract wounds, trace tear film debris, trace EBMD   Anterior Chamber deep and clear deep and clear   Iris Round and dilated Round and dilated   Lens PC IOL in good position PC IOL in good position   Anterior Vitreous clear Mild Asteroid hyalosis, Vitreous syneresis         Fundus Exam       Right Left   Disc Pink and Sharp, +cupping Pink and Sharp   C/D Ratio 0.65 0.5   Macula Flat, Blunted foveal reflex, mild RPE mottling, No heme or  edema  Flat, hazy view, Blunted foveal reflex, ERM with mild striae and small lamellar hole, cystic changes centrally -- slightly improved, no heme   Vessels mild attenuation, mild tortuosity attenuated, Tortuous   Periphery Attached, reticular degeneration, peripheral drusen, RPE atrophy nasal periphery, No heme Attached, scattered MA / DBH, reticular degeneration, peripheral drusen, hypopigmented CR scar at 0900           Refraction     Wearing Rx       Sphere Cylinder Axis Add   Right -0.75 Sphere  +2.50   Left [place] +1.00 140 2.50            IMAGING AND PROCEDURES  Imaging and Procedures for 02/28/2022  OCT, Retina - OU - Both Eyes       Right Eye Quality was good. Central Foveal Thickness: 268. Progression has been stable. Findings include normal foveal contour, no IRF, no SRF, vitreomacular adhesion .   Left Eye Quality was good. Central Foveal Thickness: 342. Progression has been stable. Findings include no SRF, abnormal foveal contour, epiretinal membrane, intraretinal fluid, lamellar hole, macular pucker, preretinal fibrosis (Mild persistent perifoveal cystic changes, ERM with central lamellar hole -- stable).   Notes *Images captured and stored on drive  Diagnosis / Impression:  OD: NFP, no IRF/SRF OS: Mild persistent perifoveal cystic changes, ERM with central lamellar hole -- stable  Clinical management:  See below  Abbreviations: NFP - Normal foveal profile. CME - cystoid macular edema. PED - pigment epithelial detachment. IRF - intraretinal fluid. SRF - subretinal fluid. EZ - ellipsoid zone. ERM - epiretinal membrane. ORA - outer retinal atrophy. ORT - outer retinal tubulation. SRHM - subretinal hyper-reflective material. IRHM - intraretinal hyper-reflective material            ASSESSMENT/PLAN:    ICD-10-CM   1. Epiretinal membrane (ERM) of left eye  H35.372 OCT, Retina - OU - Both Eyes    2. 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 Essential hypertension  I10     6. Hypertensive retinopathy of both eyes  H35.033     7. Pseudophakia of both eyes  Z96.1      1-3. Epiretinal membrane with lamellar hole and CME OS - mild ERM OS w/ central lamellar hole and surrounding cystic changes -- ?post op CME component - BCVA 20/40 -- stable - OCT shows mild persistent perifoveal cystic changes, ERM with central lamellar hole -- stable - FA 5.24.22 shows no leakage or CME - IOP 12,19 today on Cosopt and brimonidine bid OS (history of steroid reponse) - no indication for ERM surgery at this time - differential diagnosis for central cystic changes includes foveoschisis - continue Prolensa at BID OS - continue Cosopt BID OS  - continue brimonidine BID OS - f/u 6-9 months -- DFE/OCT  4. Asteroid hyalosis OS  - discussed diagnosis, prognosis and benign nature  - monitor  5,6. Hypertensive retinopathy OU - discussed importance of tight BP control - monitor  7. Pseudophakia OU  - s/p CE/IOL OU (Dr. Valetta Close)  - IOLs in good position - monitor  Ophthalmic Meds Ordered this visit:  No orders of the defined types were placed in this encounter.     Return for f/u 6-9 months, ERM OS, DFE, OCT.  There are no Patient Instructions on file for this visit.  This document serves as a record of services personally performed by Gardiner Sleeper, MD, PhD. It was created on their behalf by Orvan Falconer, an ophthalmic technician. The creation of this record is the provider's dictation and/or activities during the visit.    Electronically signed by: Orvan Falconer, OA, 03/02/22  2:07 AM  This document serves as a record of services personally performed by Gardiner Sleeper, MD, PhD. It was created on their behalf by San Jetty. Owens Shark, OA an ophthalmic technician. The creation of this record is the provider's dictation and/or activities during  the visit.    Electronically signed by: San Jetty. Owens Shark, OA @TODAY$ @ 2:07 AM  Gardiner Sleeper, M.D., Ph.D. Diseases & Surgery of the Retina and Vitreous [facility]  I have reviewed the above documentation for accuracy and completeness, and I agree with the above. Gardiner Sleeper, M.D., Ph.D. 03/02/22 2:09 AM   Abbreviations: M myopia (nearsighted); A astigmatism; H hyperopia (farsighted); P presbyopia; Mrx spectacle prescription;  CTL contact lenses; OD right eye; OS left eye; OU both eyes  XT exotropia; ET esotropia; PEK punctate epithelial keratitis; PEE punctate epithelial erosions; DES dry eye syndrome; MGD meibomian gland dysfunction; ATs artificial tears; PFAT's preservative free artificial tears; [place] nuclear sclerotic cataract; PSC posterior subcapsular cataract; ERM epi-retinal membrane; PVD posterior vitreous detachment; RD retinal detachment; DM diabetes mellitus; DR diabetic retinopathy; NPDR non-proliferative diabetic retinopathy; PDR proliferative diabetic retinopathy; CSME clinically significant macular edema; DME diabetic macular edema; dbh dot blot hemorrhages; CWS cotton wool spot; POAG primary open angle glaucoma; C/D cup-to-disc ratio; HVF humphrey visual field; GVF goldmann visual field; OCT optical coherence tomography; IOP intraocular pressure; BRVO Branch retinal vein occlusion; CRVO central retinal vein occlusion; CRAO central retinal artery occlusion; BRAO branch retinal artery occlusion; RT retinal tear; SB scleral buckle; PPV pars plana vitrectomy; VH Vitreous hemorrhage; PRP panretinal laser photocoagulation; IVK intravitreal kenalog; VMT vitreomacular traction; MH Macular hole;  NVD neovascularization of the disc; NVE neovascularization elsewhere; AREDS age related eye disease study; ARMD age related macular degeneration; POAG primary open angle glaucoma; EBMD epithelial/anterior basement membrane dystrophy; ACIOL anterior chamber intraocular lens; IOL  intraocular lens; PCIOL posterior chamber intraocular lens; Phaco/IOL phacoemulsification with intraocular lens placement; [place] photorefractive keratectomy; LASIK laser assisted in situ keratomileusis; HTN hypertension; DM diabetes mellitus; COPD chronic obstructive pulmonary disease

## 2022-03-02 ENCOUNTER — Encounter (INDEPENDENT_AMBULATORY_CARE_PROVIDER_SITE_OTHER): Payer: Self-pay | Admitting: Ophthalmology

## 2022-03-02 DIAGNOSIS — R040 Epistaxis: Secondary | ICD-10-CM | POA: Diagnosis not present

## 2022-03-02 DIAGNOSIS — J301 Allergic rhinitis due to pollen: Secondary | ICD-10-CM | POA: Diagnosis not present

## 2022-03-02 DIAGNOSIS — J3089 Other allergic rhinitis: Secondary | ICD-10-CM | POA: Diagnosis not present

## 2022-03-02 DIAGNOSIS — R052 Subacute cough: Secondary | ICD-10-CM | POA: Diagnosis not present

## 2022-03-24 ENCOUNTER — Other Ambulatory Visit (INDEPENDENT_AMBULATORY_CARE_PROVIDER_SITE_OTHER): Payer: Self-pay | Admitting: Ophthalmology

## 2022-03-28 ENCOUNTER — Other Ambulatory Visit (INDEPENDENT_AMBULATORY_CARE_PROVIDER_SITE_OTHER): Payer: Self-pay

## 2022-03-28 MED ORDER — DORZOLAMIDE HCL-TIMOLOL MAL 2-0.5 % OP SOLN: 1.0000 [drp] | Freq: Two times a day (BID) | OPHTHALMIC | 5 refills | Status: AC

## 2022-04-17 DIAGNOSIS — R7301 Impaired fasting glucose: Secondary | ICD-10-CM | POA: Diagnosis not present

## 2022-04-17 DIAGNOSIS — E7800 Pure hypercholesterolemia, unspecified: Secondary | ICD-10-CM | POA: Diagnosis not present

## 2022-04-17 DIAGNOSIS — I10 Essential (primary) hypertension: Secondary | ICD-10-CM | POA: Diagnosis not present

## 2022-04-24 DIAGNOSIS — E7800 Pure hypercholesterolemia, unspecified: Secondary | ICD-10-CM | POA: Diagnosis not present

## 2022-04-24 DIAGNOSIS — I10 Essential (primary) hypertension: Secondary | ICD-10-CM | POA: Diagnosis not present

## 2022-04-24 DIAGNOSIS — E119 Type 2 diabetes mellitus without complications: Secondary | ICD-10-CM | POA: Diagnosis not present

## 2022-04-24 DIAGNOSIS — H3322 Serous retinal detachment, left eye: Secondary | ICD-10-CM | POA: Diagnosis not present

## 2022-04-24 DIAGNOSIS — C61 Malignant neoplasm of prostate: Secondary | ICD-10-CM | POA: Diagnosis not present

## 2022-04-24 DIAGNOSIS — H35033 Hypertensive retinopathy, bilateral: Secondary | ICD-10-CM | POA: Diagnosis not present

## 2022-04-24 DIAGNOSIS — Z6832 Body mass index (BMI) 32.0-32.9, adult: Secondary | ICD-10-CM | POA: Diagnosis not present

## 2022-04-24 DIAGNOSIS — I6529 Occlusion and stenosis of unspecified carotid artery: Secondary | ICD-10-CM | POA: Diagnosis not present

## 2022-04-24 DIAGNOSIS — C7A8 Other malignant neuroendocrine tumors: Secondary | ICD-10-CM | POA: Diagnosis not present

## 2022-04-24 DIAGNOSIS — J309 Allergic rhinitis, unspecified: Secondary | ICD-10-CM | POA: Diagnosis not present

## 2022-04-24 DIAGNOSIS — N400 Enlarged prostate without lower urinary tract symptoms: Secondary | ICD-10-CM | POA: Diagnosis not present

## 2022-04-24 DIAGNOSIS — Z23 Encounter for immunization: Secondary | ICD-10-CM | POA: Diagnosis not present

## 2022-05-16 DIAGNOSIS — C61 Malignant neoplasm of prostate: Secondary | ICD-10-CM | POA: Diagnosis not present

## 2022-05-29 DIAGNOSIS — C61 Malignant neoplasm of prostate: Secondary | ICD-10-CM | POA: Diagnosis not present

## 2022-05-29 DIAGNOSIS — R3915 Urgency of urination: Secondary | ICD-10-CM | POA: Diagnosis not present

## 2022-05-29 DIAGNOSIS — N401 Enlarged prostate with lower urinary tract symptoms: Secondary | ICD-10-CM | POA: Diagnosis not present

## 2022-06-08 DIAGNOSIS — I10 Essential (primary) hypertension: Secondary | ICD-10-CM | POA: Diagnosis not present

## 2022-06-08 DIAGNOSIS — E119 Type 2 diabetes mellitus without complications: Secondary | ICD-10-CM | POA: Diagnosis not present

## 2022-06-08 DIAGNOSIS — E7800 Pure hypercholesterolemia, unspecified: Secondary | ICD-10-CM | POA: Diagnosis not present

## 2022-07-17 DIAGNOSIS — E119 Type 2 diabetes mellitus without complications: Secondary | ICD-10-CM | POA: Diagnosis not present

## 2022-07-17 DIAGNOSIS — E7800 Pure hypercholesterolemia, unspecified: Secondary | ICD-10-CM | POA: Diagnosis not present

## 2022-07-24 DIAGNOSIS — D3A8 Other benign neuroendocrine tumors: Secondary | ICD-10-CM | POA: Diagnosis not present

## 2022-07-24 DIAGNOSIS — R978 Other abnormal tumor markers: Secondary | ICD-10-CM | POA: Diagnosis not present

## 2022-07-24 DIAGNOSIS — R7989 Other specified abnormal findings of blood chemistry: Secondary | ICD-10-CM | POA: Diagnosis not present

## 2022-07-24 DIAGNOSIS — R799 Abnormal finding of blood chemistry, unspecified: Secondary | ICD-10-CM | POA: Diagnosis not present

## 2022-07-24 DIAGNOSIS — C7A8 Other malignant neuroendocrine tumors: Secondary | ICD-10-CM | POA: Diagnosis not present

## 2022-07-24 DIAGNOSIS — K229 Disease of esophagus, unspecified: Secondary | ICD-10-CM | POA: Diagnosis not present

## 2022-07-27 DIAGNOSIS — Z8673 Personal history of transient ischemic attack (TIA), and cerebral infarction without residual deficits: Secondary | ICD-10-CM | POA: Diagnosis not present

## 2022-07-27 DIAGNOSIS — J309 Allergic rhinitis, unspecified: Secondary | ICD-10-CM | POA: Diagnosis not present

## 2022-07-27 DIAGNOSIS — Z23 Encounter for immunization: Secondary | ICD-10-CM | POA: Diagnosis not present

## 2022-07-27 DIAGNOSIS — I10 Essential (primary) hypertension: Secondary | ICD-10-CM | POA: Diagnosis not present

## 2022-07-27 DIAGNOSIS — E785 Hyperlipidemia, unspecified: Secondary | ICD-10-CM | POA: Diagnosis not present

## 2022-07-27 DIAGNOSIS — C7A8 Other malignant neuroendocrine tumors: Secondary | ICD-10-CM | POA: Diagnosis not present

## 2022-07-27 DIAGNOSIS — E7800 Pure hypercholesterolemia, unspecified: Secondary | ICD-10-CM | POA: Diagnosis not present

## 2022-07-27 DIAGNOSIS — I6529 Occlusion and stenosis of unspecified carotid artery: Secondary | ICD-10-CM | POA: Diagnosis not present

## 2022-07-27 DIAGNOSIS — N400 Enlarged prostate without lower urinary tract symptoms: Secondary | ICD-10-CM | POA: Diagnosis not present

## 2022-07-27 DIAGNOSIS — Z6831 Body mass index (BMI) 31.0-31.9, adult: Secondary | ICD-10-CM | POA: Diagnosis not present

## 2022-07-27 DIAGNOSIS — E119 Type 2 diabetes mellitus without complications: Secondary | ICD-10-CM | POA: Diagnosis not present

## 2022-07-27 DIAGNOSIS — C61 Malignant neoplasm of prostate: Secondary | ICD-10-CM | POA: Diagnosis not present

## 2022-08-14 ENCOUNTER — Other Ambulatory Visit (INDEPENDENT_AMBULATORY_CARE_PROVIDER_SITE_OTHER): Payer: Self-pay

## 2022-08-14 MED ORDER — BRIMONIDINE TARTRATE 0.2 % OP SOLN: 1.0000 [drp] | Freq: Two times a day (BID) | OPHTHALMIC | 1 refills | Status: DC

## 2022-09-14 NOTE — Progress Notes (Signed)
[facility] - Clinic Note  09/26/2022     CHIEF COMPLAINT Patient presents for Retina Follow Up   HISTORY OF PRESENT ILLNESS: Jesse Goodwin is a 77 y.o. male who presents to the clinic today for:  HPI     Retina Follow Up   Patient presents with  Other.  In left eye.  This started 7 months ago.  I, the attending physician,  performed the HPI with the patient and updated documentation appropriately.        Comments   Patient here for 7 months retina follow up for ERM OS. Patient states vision doing fine. No eye pain. Using all drops. Added Metformin.      Last edited by Rennis Chris, MD on 09/30/2022  1:14 PM.     Pt has not noticed any changes in vision  Referring physician: Gweneth Dimitri, MD [address] [place],  [place] [address]  HISTORICAL INFORMATION:   Selected notes from the MEDICAL RECORD NUMBER Referred by Dr. Cathey Endow for eval of VMT OS   CURRENT MEDICATIONS: Current Outpatient Medications (Ophthalmic Drugs)  Medication Sig   dorzolamide-timolol (COSOPT) 2-0.5 % ophthalmic solution Place 1 drop into the left eye 2 (two) times daily.   brimonidine (ALPHAGAN) 0.2 % ophthalmic solution Place 1 drop into the left eye 2 (two) times daily.   Bromfenac Sodium (PROLENSA) 0.07 % SOLN Place 1 drop into the left eye 2 (two) times daily.   prednisoLONE acetate (PRED FORTE) 1 % ophthalmic suspension Place 1 drop into the left eye daily. (Patient not taking: Reported on 01/24/2022)   Tetrahydrozoline HCl (VISINE OP) Place 2 drops into both eyes daily as needed (dry eye). (Patient not taking: Reported on 09/26/2022)   No current facility-administered medications for this visit. (Ophthalmic Drugs)   Current Outpatient Medications (Other)  Medication Sig   amLODipine (NORVASC) 10 MG tablet Take 10 mg by mouth daily.   aspirin 325 MG EC tablet Take 325 mg by mouth daily.   azelastine (ASTELIN) 0.1 % nasal spray    losartan (COZAAR) 100 MG tablet     metformin (FORTAMET) 500 MG (OSM) 24 hr tablet Take 500 mg by mouth daily with supper.   simvastatin (ZOCOR) 20 MG tablet Take 20 mg by mouth at bedtime.   Triamcinolone Acetonide (NASACORT ALLERGY 24HR NA) Place into the nose daily. 1 spray each nostril   Triamcinolone Acetonide (NASACORT AQ NA)    Nirmatrelvir & Ritonavir (PAXLOVID) 20 x 150 MG & 10 x 100MG  TBPK See admin instructions. (Patient not taking: Reported on 09/26/2022)   No current facility-administered medications for this visit. (Other)   REVIEW OF SYSTEMS: ROS   Positive for: Neurological, Genitourinary, Eyes Negative for: Constitutional, Gastrointestinal, Skin, Musculoskeletal, HENT, Endocrine, Cardiovascular, Respiratory, Psychiatric, Allergic/Imm, Heme/Lymph Last edited by Laddie Aquas, COA on 09/26/2022  1:58 PM.      ALLERGIES No Known Allergies  PAST MEDICAL HISTORY Past Medical History:  Diagnosis Date   Carotid artery occlusion    Hypercholesteremia    Hypertension    Hypertensive retinopathy    Pre-diabetes    pre-dm    Prostate cancer (HCC)    Stroke (HCC)    2014; it was a carotid artery stroke    Past Surgical History:  Procedure Laterality Date   CAROTID ENDARTERECTOMY     2014 FOR TREATMENT OF STROKE    CATARACT EXTRACTION     CHOLECYSTECTOMY     living in  at this time,  does not remember when or who did it    EYE SURGERY     PROSTATE BIOPSY  2019   RADIOACTIVE SEED IMPLANT N/A 04/30/2017   Procedure: RADIOACTIVE SEED IMPLANT/BRACHYTHERAPY IMPLANT;  Surgeon: Malen Gauze, MD;  Location: [facility];  Service: Urology;  Laterality: N/A;   SPACE OAR INSTILLATION N/A 04/30/2017   Procedure: SPACE OAR INSTILLATION;  Surgeon: Malen Gauze, MD;  Location: [facility];  Service: Urology;  Laterality: N/A;   FAMILY HISTORY Family History  Problem Relation Age of Onset   Stroke Mother    Breast cancer Mother    Dementia Mother    CAD Mother     Diabetes Mellitus II Mother    Cancer Mother        breast   Diabetes Mother    Heart disease Mother    Hyperlipidemia Mother    Hypertension Mother    Hyperlipidemia Brother    Hyperlipidemia Son    Stroke Sister    SOCIAL HISTORY Social History   Tobacco Use   Smoking status: Never   Smokeless tobacco: Never  Vaping Use   Vaping status: Never Used  Substance Use Topics   Alcohol use: Yes    Alcohol/week: 14.0 standard drinks of alcohol    Types: 7 Glasses of wine, 7 Shots of liquor per week    Comment: drinks mixed drinks and wine in the night.   Drug use: No       OPHTHALMIC EXAM: Base Eye Exam     Visual Acuity (Snellen - Linear)       Right Left   Dist cc 20/30 -2 20/30 -2   Dist ph cc 20/25 NI    Correction: Glasses         Tonometry (Tonopen, 1:56 PM)       Right Left   Pressure 07 06         Pupils       Dark Light Shape React APD   Right 3 2 Round Brisk None   Left 4 3 Round Minimal None         Visual Fields (Counting fingers)       Left Right    Full Full         Extraocular Movement       Right Left    Full, Ortho Full, Ortho         Neuro/Psych     Oriented x3: Yes   Mood/Affect: Normal         Dilation     Both eyes: 1.0% Mydriacyl, 2.5% Phenylephrine @ 1:55 PM           Slit Lamp and Fundus Exam     Slit Lamp Exam       Right Left   Lids/Lashes Dermatochalasis - upper lid, MGD, marginal papilloma UL Dermatochalasis - upper lid, Meibomian gland dysfunction   Conjunctiva/Sclera White and quiet White and quiet   Cornea arcus, well healed temporal cataract wounds, mild tear film debris, trace EBMD arcus, well healed temporal cataract wounds, trace tear film debris, trace EBMD   Anterior Chamber deep and clear deep and clear   Iris Round and dilated Round and dilated   Lens PC IOL in good position PC IOL in good position   Anterior Vitreous clear Mild Asteroid hyalosis, Vitreous syneresis          Fundus Exam       Right Left   Disc  Pink and Sharp, +cupping Pink and Sharp   C/D Ratio 0.65 0.5   Macula Flat, Blunted foveal reflex, mild RPE mottling, No heme or edema Flat, hazy view, Blunted foveal reflex, ERM with mild striae and small lamellar hole, cystic changes centrally, no heme   Vessels mild attenuation, mild tortuosity attenuated, mild tortuosity   Periphery Attached, reticular degeneration, peripheral drusen, RPE atrophy nasal periphery -- all stable, No heme Attached, no heme, reticular degeneration, peripheral drusen, hypopigmented CR scar at 0900           Refraction     Wearing Rx       Sphere Cylinder Axis Add   Right -0.75 Sphere  +2.50   Left [place] +1.00 140 2.50            IMAGING AND PROCEDURES  Imaging and Procedures for 09/26/2022  OCT, Retina - OU - Both Eyes       Right Eye Quality was good. Central Foveal Thickness: 267. Progression has been stable. Findings include normal foveal contour, no IRF, no SRF, vitreomacular adhesion .   Left Eye Quality was good. Central Foveal Thickness: 311. Progression has been stable. Findings include no SRF, abnormal foveal contour, epiretinal membrane, intraretinal fluid, lamellar hole, macular pucker, preretinal fibrosis (ERM with central lamellar hole -- stable, mild persistent perifoveal cystic changes).   Notes *Images captured and stored on drive  Diagnosis / Impression:  OD: NFP, no IRF/SRF OS: ERM with central lamellar hole -- stable, mild persistent perifoveal cystic changes  Clinical management:  See below  Abbreviations: NFP - Normal foveal profile. CME - cystoid macular edema. PED - pigment epithelial detachment. IRF - intraretinal fluid. SRF - subretinal fluid. EZ - ellipsoid zone. ERM - epiretinal membrane. ORA - outer retinal atrophy. ORT - outer retinal tubulation. SRHM - subretinal hyper-reflective material. IRHM - intraretinal hyper-reflective material             ASSESSMENT/PLAN:    ICD-10-CM   1. Epiretinal membrane (ERM) of left eye  H35.372 OCT, Retina - OU - Both Eyes    2. 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 Diabetes mellitus type 2 without retinopathy (HCC)  E11.9     6. Long term (current) use of oral hypoglycemic drugs  Z79.84     7. Essential hypertension  I10     8. Hypertensive retinopathy of both eyes  H35.033     9. Pseudophakia of both eyes  Z96.1       1-3. Epiretinal membrane with lamellar hole and CME OS - mild ERM OS w/ central lamellar hole and surrounding cystic changes -- ?post op CME component - BCVA 20/40 -- stable - OCT shows ERM with central lamellar hole -- stable, mild persistent perifoveal cystic changes - FA 5.24.22 shows no leakage or CME - IOP 07,06 today on Cosopt and brimonidine bid OS (history of steroid reponse) - no indication for ERM surgery at this time - differential diagnosis for central cystic changes includes foveoschisis - continue Prolensa at BID OS - continue Cosopt BID OS -- okay to stop - continue brimonidine BID OS - f/u 9 months -- DFE/OCT  4,5. Diabetes mellitus, type 2 without retinopathy - The incidence, risk factors for progression, natural history and treatment options for diabetic retinopathy  were discussed with patient.   - The need  for close monitoring of blood glucose, blood pressure, and serum lipids, avoiding cigarette or any type of tobacco, and the need for long term follow up was also discussed with patient. - f/u in 1 year, sooner prn   6. Asteroid hyalosis OS  - discussed diagnosis, prognosis and benign nature  - monitor  7,8. Hypertensive retinopathy OU - discussed importance of tight BP control - monitor  9. Pseudophakia OU  - s/p CE/IOL OU (Dr. Cathey Endow)  - IOLs in good position - monitor  Ophthalmic Meds Ordered this visit:  Meds ordered this  encounter  Medications   Bromfenac Sodium (PROLENSA) 0.07 % SOLN    Sig: Place 1 drop into the left eye 2 (two) times daily.    Dispense:  6 mL    Refill:  10   brimonidine (ALPHAGAN) 0.2 % ophthalmic solution    Sig: Place 1 drop into the left eye 2 (two) times daily.    Dispense:  10 mL    Refill:  10      Return in about 9 months (around 06/26/2023) for f/u ERM OS, DFE, OCT.  There are no Patient Instructions on file for this visit.  This document serves as a record of services personally performed by Karie Chimera, MD, PhD. It was created on their behalf by Laurey Morale, COT an ophthalmic technician. The creation of this record is the provider's dictation and/or activities during the visit.    Electronically signed by:  Charlette Caffey, COT  09/30/22 1:18 PM  This document serves as a record of services personally performed by Karie Chimera, MD, PhD. It was created on their behalf by Glee Arvin. Manson Passey, OA an ophthalmic technician. The creation of this record is the provider's dictation and/or activities during the visit.    Electronically signed by: Glee Arvin. Manson Passey, OA 09/30/22 1:18 PM  Karie Chimera, M.D., Ph.D. Diseases & Surgery of the Retina and Vitreous [facility]  I have reviewed the above documentation for accuracy and completeness, and I agree with the above. Karie Chimera, M.D., Ph.D. 09/30/22 1:19 PM  Abbreviations: M myopia (nearsighted); A astigmatism; H hyperopia (farsighted); P presbyopia; Mrx spectacle prescription;  CTL contact lenses; OD right eye; OS left eye; OU both eyes  XT exotropia; ET esotropia; PEK punctate epithelial keratitis; PEE punctate epithelial erosions; DES dry eye syndrome; MGD meibomian gland dysfunction; ATs artificial tears; PFAT's preservative free artificial tears; NSC nuclear sclerotic cataract; PSC posterior subcapsular cataract; ERM epi-retinal membrane; PVD posterior vitreous detachment; RD retinal  detachment; DM diabetes mellitus; DR diabetic retinopathy; NPDR non-proliferative diabetic retinopathy; PDR proliferative diabetic retinopathy; CSME clinically significant macular edema; DME diabetic macular edema; dbh dot blot hemorrhages; CWS cotton wool spot; POAG primary open angle glaucoma; C/D cup-to-disc ratio; HVF humphrey visual field; GVF goldmann visual field; OCT optical coherence tomography; IOP intraocular pressure; BRVO Branch retinal vein occlusion; CRVO central retinal vein occlusion; CRAO central retinal artery occlusion; BRAO branch retinal artery occlusion; RT retinal tear; SB scleral buckle; PPV pars plana vitrectomy; VH Vitreous hemorrhage; PRP panretinal laser photocoagulation; IVK intravitreal kenalog; VMT vitreomacular traction; MH Macular hole;  NVD neovascularization of the disc; NVE neovascularization elsewhere; AREDS age related eye disease study; ARMD age related macular degeneration; POAG primary open angle glaucoma; EBMD epithelial/anterior basement membrane dystrophy; ACIOL anterior chamber intraocular lens; IOL intraocular lens; PCIOL posterior chamber intraocular lens; Phaco/IOL phacoemulsification with intraocular lens placement; PRK photorefractive keratectomy; LASIK laser assisted in situ keratomileusis; HTN  hypertension; DM diabetes mellitus; COPD chronic obstructive pulmonary disease

## 2022-09-26 ENCOUNTER — Encounter (INDEPENDENT_AMBULATORY_CARE_PROVIDER_SITE_OTHER): Payer: Self-pay | Admitting: Ophthalmology

## 2022-09-26 ENCOUNTER — Ambulatory Visit (INDEPENDENT_AMBULATORY_CARE_PROVIDER_SITE_OTHER): Payer: Medicare Other | Admitting: Ophthalmology

## 2022-09-26 DIAGNOSIS — H35342 Macular cyst, hole, or pseudohole, left eye: Secondary | ICD-10-CM

## 2022-09-26 DIAGNOSIS — H4322 Crystalline deposits in vitreous body, left eye: Secondary | ICD-10-CM

## 2022-09-26 DIAGNOSIS — H35372 Puckering of macula, left eye: Secondary | ICD-10-CM | POA: Diagnosis not present

## 2022-09-26 DIAGNOSIS — H35033 Hypertensive retinopathy, bilateral: Secondary | ICD-10-CM

## 2022-09-26 DIAGNOSIS — E119 Type 2 diabetes mellitus without complications: Secondary | ICD-10-CM | POA: Diagnosis not present

## 2022-09-26 DIAGNOSIS — Z7984 Long term (current) use of oral hypoglycemic drugs: Secondary | ICD-10-CM | POA: Diagnosis not present

## 2022-09-26 DIAGNOSIS — I10 Essential (primary) hypertension: Secondary | ICD-10-CM | POA: Diagnosis not present

## 2022-09-26 DIAGNOSIS — H35352 Cystoid macular degeneration, left eye: Secondary | ICD-10-CM | POA: Diagnosis not present

## 2022-09-26 DIAGNOSIS — Z961 Presence of intraocular lens: Secondary | ICD-10-CM | POA: Diagnosis not present

## 2022-09-26 MED ORDER — BRIMONIDINE TARTRATE 0.2 % OP SOLN: 1.0000 [drp] | Freq: Two times a day (BID) | OPHTHALMIC | 10 refills | Status: AC

## 2022-09-26 MED ORDER — BROMFENAC SODIUM 0.07 % OP SOLN: 1.0000 [drp] | Freq: Two times a day (BID) | OPHTHALMIC | 10 refills | Status: AC

## 2022-09-30 ENCOUNTER — Encounter (INDEPENDENT_AMBULATORY_CARE_PROVIDER_SITE_OTHER): Payer: Self-pay | Admitting: Ophthalmology

## 2022-10-03 ENCOUNTER — Other Ambulatory Visit (INDEPENDENT_AMBULATORY_CARE_PROVIDER_SITE_OTHER): Payer: Self-pay

## 2022-10-03 MED ORDER — PROLENSA 0.07 % OP SOLN: 1.0000 [drp] | Freq: Two times a day (BID) | OPHTHALMIC | 6 refills | Status: AC

## 2022-10-03 MED ORDER — KETOROLAC TROMETHAMINE 0.5 % OP SOLN: 1.0000 [drp] | Freq: Two times a day (BID) | OPHTHALMIC | 1 refills | Status: DC

## 2022-10-16 DIAGNOSIS — Z23 Encounter for immunization: Secondary | ICD-10-CM | POA: Diagnosis not present

## 2022-11-20 DIAGNOSIS — C61 Malignant neoplasm of prostate: Secondary | ICD-10-CM | POA: Diagnosis not present

## 2022-11-27 DIAGNOSIS — R3915 Urgency of urination: Secondary | ICD-10-CM | POA: Diagnosis not present

## 2022-11-27 DIAGNOSIS — C61 Malignant neoplasm of prostate: Secondary | ICD-10-CM | POA: Diagnosis not present

## 2023-03-06 DIAGNOSIS — R040 Epistaxis: Secondary | ICD-10-CM | POA: Diagnosis not present

## 2023-03-06 DIAGNOSIS — J3089 Other allergic rhinitis: Secondary | ICD-10-CM | POA: Diagnosis not present

## 2023-03-06 DIAGNOSIS — R052 Subacute cough: Secondary | ICD-10-CM | POA: Diagnosis not present

## 2023-03-06 DIAGNOSIS — J301 Allergic rhinitis due to pollen: Secondary | ICD-10-CM | POA: Diagnosis not present

## 2023-03-19 DIAGNOSIS — Z8673 Personal history of transient ischemic attack (TIA), and cerebral infarction without residual deficits: Secondary | ICD-10-CM | POA: Diagnosis not present

## 2023-03-19 DIAGNOSIS — E66811 Obesity, class 1: Secondary | ICD-10-CM | POA: Diagnosis not present

## 2023-03-19 DIAGNOSIS — Z6831 Body mass index (BMI) 31.0-31.9, adult: Secondary | ICD-10-CM | POA: Diagnosis not present

## 2023-03-19 DIAGNOSIS — E7800 Pure hypercholesterolemia, unspecified: Secondary | ICD-10-CM | POA: Diagnosis not present

## 2023-03-19 DIAGNOSIS — N400 Enlarged prostate without lower urinary tract symptoms: Secondary | ICD-10-CM | POA: Diagnosis not present

## 2023-03-19 DIAGNOSIS — J309 Allergic rhinitis, unspecified: Secondary | ICD-10-CM | POA: Diagnosis not present

## 2023-03-19 DIAGNOSIS — E119 Type 2 diabetes mellitus without complications: Secondary | ICD-10-CM | POA: Diagnosis not present

## 2023-03-19 DIAGNOSIS — I10 Essential (primary) hypertension: Secondary | ICD-10-CM | POA: Diagnosis not present

## 2023-04-25 DIAGNOSIS — Z6830 Body mass index (BMI) 30.0-30.9, adult: Secondary | ICD-10-CM | POA: Diagnosis not present

## 2023-04-25 DIAGNOSIS — R051 Acute cough: Secondary | ICD-10-CM | POA: Diagnosis not present

## 2023-04-30 DIAGNOSIS — J205 Acute bronchitis due to respiratory syncytial virus: Secondary | ICD-10-CM | POA: Diagnosis not present

## 2023-04-30 DIAGNOSIS — Z6829 Body mass index (BMI) 29.0-29.9, adult: Secondary | ICD-10-CM | POA: Diagnosis not present

## 2023-05-16 ENCOUNTER — Other Ambulatory Visit (INDEPENDENT_AMBULATORY_CARE_PROVIDER_SITE_OTHER): Payer: Self-pay | Admitting: Ophthalmology

## 2023-05-24 DIAGNOSIS — H524 Presbyopia: Secondary | ICD-10-CM | POA: Diagnosis not present

## 2023-05-24 DIAGNOSIS — H04123 Dry eye syndrome of bilateral lacrimal glands: Secondary | ICD-10-CM | POA: Diagnosis not present

## 2023-05-24 DIAGNOSIS — H35372 Puckering of macula, left eye: Secondary | ICD-10-CM | POA: Diagnosis not present

## 2023-06-14 DIAGNOSIS — H04123 Dry eye syndrome of bilateral lacrimal glands: Secondary | ICD-10-CM | POA: Diagnosis not present

## 2023-06-26 ENCOUNTER — Encounter (INDEPENDENT_AMBULATORY_CARE_PROVIDER_SITE_OTHER): Payer: Medicare Other | Admitting: Ophthalmology

## 2023-06-26 DIAGNOSIS — E119 Type 2 diabetes mellitus without complications: Secondary | ICD-10-CM | POA: Diagnosis not present

## 2023-06-26 DIAGNOSIS — E7800 Pure hypercholesterolemia, unspecified: Secondary | ICD-10-CM | POA: Diagnosis not present

## 2023-07-03 DIAGNOSIS — E66811 Obesity, class 1: Secondary | ICD-10-CM | POA: Diagnosis not present

## 2023-07-03 DIAGNOSIS — Z6830 Body mass index (BMI) 30.0-30.9, adult: Secondary | ICD-10-CM | POA: Diagnosis not present

## 2023-07-03 DIAGNOSIS — I10 Essential (primary) hypertension: Secondary | ICD-10-CM | POA: Diagnosis not present

## 2023-07-03 DIAGNOSIS — Z8673 Personal history of transient ischemic attack (TIA), and cerebral infarction without residual deficits: Secondary | ICD-10-CM | POA: Diagnosis not present

## 2023-07-03 DIAGNOSIS — N400 Enlarged prostate without lower urinary tract symptoms: Secondary | ICD-10-CM | POA: Diagnosis not present

## 2023-07-03 DIAGNOSIS — E119 Type 2 diabetes mellitus without complications: Secondary | ICD-10-CM | POA: Diagnosis not present

## 2023-07-03 DIAGNOSIS — E7800 Pure hypercholesterolemia, unspecified: Secondary | ICD-10-CM | POA: Diagnosis not present

## 2023-07-03 DIAGNOSIS — J205 Acute bronchitis due to respiratory syncytial virus: Secondary | ICD-10-CM | POA: Diagnosis not present

## 2023-07-03 DIAGNOSIS — Z23 Encounter for immunization: Secondary | ICD-10-CM | POA: Diagnosis not present

## 2023-07-04 NOTE — Progress Notes (Signed)
 [facility] - Clinic Note  07/11/2023     CHIEF COMPLAINT Patient presents for Retina Follow Up   HISTORY OF PRESENT ILLNESS: Jesse Goodwin is a 78 y.o. male who presents to the clinic today for:  HPI     Retina Follow Up   Patient presents with  Other.  In left eye.  This started 9 months ago.  I, the attending physician,  performed the HPI with the patient and updated documentation appropriately.        Comments   Patient here for 9 months retina follow up for ERM OS. Patient states vision seem to be ok. No eye pain. Broke glasses. Getting new frame.      Last edited by Valdemar Rogue, MD on 07/15/2023 11:57 AM.    Pt states he saw Dr. Waylan 6 weeks ago and got new glasses, he states they are not working so he has to go back, he has been using Systane TID OU and feels like that has helped improve his vision   Referring physician: Waylan Cain, MD [address] [place],  [place] [address]  HISTORICAL INFORMATION:   Selected notes from the MEDICAL RECORD NUMBER Referred by Dr. Waylan for eval of VMT OS   CURRENT MEDICATIONS: Current Outpatient Medications (Ophthalmic Drugs)  Medication Sig   brimonidine  (ALPHAGAN ) 0.2 % ophthalmic solution Place 1 drop into the left eye 2 (two) times daily.   Bromfenac  Sodium (PROLENSA ) 0.07 % SOLN Place 1 drop into the left eye 2 (two) times daily.   dorzolamide -timolol  (COSOPT ) 2-0.5 % ophthalmic solution Place 1 drop into the left eye 2 (two) times daily.   ketorolac  (ACULAR ) 0.5 % ophthalmic solution PLACE 1 DROP INTO THE LEFT EYE IN THE MORNING AND AT BEDTIME AS DIRECTED   PROLENSA  0.07 % SOLN Place 1 drop into the left eye 2 (two) times daily.   prednisoLONE  acetate (PRED FORTE ) 1 % ophthalmic suspension Place 1 drop into the left eye daily. (Patient not taking: Reported on 07/11/2023)   Tetrahydrozoline HCl (VISINE OP) Place 2 drops into both eyes daily as needed (dry eye). (Patient not taking: Reported on 07/11/2023)    No current facility-administered medications for this visit. (Ophthalmic Drugs)   Current Outpatient Medications (Other)  Medication Sig   amLODipine (NORVASC) 10 MG tablet Take 10 mg by mouth daily.   aspirin  325 MG EC tablet Take 325 mg by mouth daily.   azelastine (ASTELIN) 0.1 % nasal spray    losartan (COZAAR) 100 MG tablet    metformin (FORTAMET) 500 MG (OSM) 24 hr tablet Take 500 mg by mouth daily with supper.   simvastatin  (ZOCOR ) 20 MG tablet Take 20 mg by mouth at bedtime.   Triamcinolone Acetonide (NASACORT ALLERGY 24HR NA) Place into the nose daily. 1 spray each nostril   Triamcinolone Acetonide (NASACORT AQ NA)    Nirmatrelvir & Ritonavir (PAXLOVID) 20 x 150 MG & 10 x 100MG  TBPK See admin instructions. (Patient not taking: Reported on 07/11/2023)   No current facility-administered medications for this visit. (Other)   REVIEW OF SYSTEMS: ROS   Positive for: Neurological, Genitourinary, Eyes Negative for: Constitutional, Gastrointestinal, Skin, Musculoskeletal, HENT, Endocrine, Cardiovascular, Respiratory, Psychiatric, Allergic/Imm, Heme/Lymph Last edited by Orval Asberry RAMAN, COA on 07/11/2023  1:42 PM.       ALLERGIES No Known Allergies  PAST MEDICAL HISTORY Past Medical History:  Diagnosis Date   Carotid artery occlusion    Hypercholesteremia    Hypertension  Hypertensive retinopathy    Pre-diabetes    pre-dm    Prostate cancer (HCC)    Stroke (HCC)    2014; it was a carotid artery stroke    Past Surgical History:  Procedure Laterality Date   CAROTID ENDARTERECTOMY     2014 FOR TREATMENT OF STROKE    CATARACT EXTRACTION     CHOLECYSTECTOMY     living in [place] at this time, does not remember when or who did it    EYE SURGERY     PROSTATE BIOPSY  2019   RADIOACTIVE SEED IMPLANT N/A 04/30/2017   Procedure: RADIOACTIVE SEED IMPLANT/BRACHYTHERAPY IMPLANT;  Surgeon: Sherrilee Belvie CROME, MD;  Location: [facility];  Service: Urology;   Laterality: N/A;   SPACE OAR INSTILLATION N/A 04/30/2017   Procedure: SPACE OAR INSTILLATION;  Surgeon: Sherrilee Belvie CROME, MD;  Location: [facility] [place];  Service: Urology;  Laterality: N/A;   FAMILY HISTORY Family History  Problem Relation Age of Onset   Stroke Mother    Breast cancer Mother    Dementia Mother    CAD Mother    Diabetes Mellitus II Mother    Cancer Mother        breast   Diabetes Mother    Heart disease Mother    Hyperlipidemia Mother    Hypertension Mother    Hyperlipidemia Brother    Hyperlipidemia Son    Stroke Sister    SOCIAL HISTORY Social History   Tobacco Use   Smoking status: Never   Smokeless tobacco: Never  Vaping Use   Vaping status: Never Used  Substance Use Topics   Alcohol use: Yes    Alcohol/week: 14.0 standard drinks of alcohol    Types: 7 Glasses of wine, 7 Shots of liquor per week    Comment: drinks mixed drinks and wine in the night.   Drug use: No       OPHTHALMIC EXAM: Base Eye Exam     Visual Acuity (Snellen - Linear)       Right Left   Dist [place] 20/30 +2 20/30 -1   Dist ph [place] 20/25 -1 NI  Broke glasses. Has to get new frame.        Tonometry (Tonopen, 1:40 PM)       Right Left   Pressure 12 16         Pupils       Dark Light Shape React APD   Right 3 2 Round Brisk None   Left 4 3 Round Minimal None         Visual Fields (Counting fingers)       Left Right    Full Full         Extraocular Movement       Right Left    Full, Ortho Full, Ortho         Neuro/Psych     Oriented x3: Yes   Mood/Affect: Normal         Dilation     Both eyes: 1.0% Mydriacyl, 2.5% Phenylephrine @ 1:39 PM           Slit Lamp and Fundus Exam     Slit Lamp Exam       Right Left   Lids/Lashes Dermatochalasis - upper lid, MGD, marginal papilloma UL Dermatochalasis - upper lid, Meibomian gland dysfunction   Conjunctiva/Sclera White and quiet White and quiet   Cornea arcus, well healed  temporal cataract wounds, mild tear film debris,  trace EBMD arcus, well healed temporal cataract wounds, trace tear film debris, trace EBMD   Anterior Chamber deep and clear deep and clear   Iris Round and dilated Round and dilated   Lens PC IOL in good position PC IOL in good position   Anterior Vitreous mild syneresis Mild asteroid hyalosis, Vitreous syneresis         Fundus Exam       Right Left   Disc Pink and Sharp, +cupping Pink and Sharp   C/D Ratio 0.65 0.5   Macula Flat, Blunted foveal reflex, mild RPE mottling, No heme or edema Flat, Blunted foveal reflex, ERM with mild striae and small lamellar hole, cystic changes centrally, no heme   Vessels attenuated, mild tortuosity mild attenuation, mild tortuosity   Periphery Attached, reticular degeneration, peripheral drusen, RPE atrophy nasal periphery -- all stable, No heme Attached, no heme, reticular degeneration, peripheral drusen, hypopigmented CR scar at 0900           Refraction     Wearing Rx       Sphere Cylinder Axis Add   Right -0.75 Sphere  +2.50   Left [place] +1.00 140 2.50            IMAGING AND PROCEDURES  Imaging and Procedures for 07/11/2023  OCT, Retina - OU - Both Eyes       Right Eye Quality was good. Central Foveal Thickness: 268. Progression has been stable. Findings include normal foveal contour, no IRF, no SRF, vitreomacular adhesion .   Left Eye Quality was good. Central Foveal Thickness: 304. Progression has been stable. Findings include no SRF, abnormal foveal contour, epiretinal membrane, intraretinal fluid, lamellar hole, macular pucker, preretinal fibrosis (ERM with central lamellar hole / foveal notches -- stable, mild persistent perifoveal cystic changes).   Notes *Images captured and stored on drive  Diagnosis / Impression:  OD: NFP, no IRF/SRF OS: ERM with central lamellar hole / foveal notches -- stable, mild persistent perifoveal cystic changes  Clinical management:  See  below  Abbreviations: NFP - Normal foveal profile. CME - cystoid macular edema. PED - pigment epithelial detachment. IRF - intraretinal fluid. SRF - subretinal fluid. EZ - ellipsoid zone. ERM - epiretinal membrane. ORA - outer retinal atrophy. ORT - outer retinal tubulation. SRHM - subretinal hyper-reflective material. IRHM - intraretinal hyper-reflective material             ASSESSMENT/PLAN:    ICD-10-CM   1. Epiretinal membrane (ERM) of left eye  H35.372 OCT, Retina - OU - Both Eyes    2. 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 Diabetes mellitus type 2 without retinopathy (HCC)  E11.9     6. Long term (current) use of oral hypoglycemic drugs  Z79.84     7. Essential hypertension  I10     8. Hypertensive retinopathy of both eyes  H35.033     9. Pseudophakia of both eyes  Z96.1       1-3. Epiretinal membrane with lamellar hole and CME OS - mild ERM OS w/ central lamellar hole and surrounding cystic changes -- ?post op CME component - BCVA 20/30 -- stable - OCT shows ERM with central lamellar hole / foveal notches -- stable, mild persistent perifoveal cystic changes - FA 5.24.22 shows no leakage or CME - IOP 12,16 today on  Cosopt  and brimonidine  bid OS (history of steroid reponse) - no indication for ERM surgery at this time - differential diagnosis for central cystic changes includes foveoschisis - continue Prolensa  at BID OS - continue Cosopt  BID OS -- okay to stop - continue brimonidine  BID OS - f/u 9-12 months -- DFE/OCT  4,5. Diabetes mellitus, type 2 without retinopathy - The incidence, risk factors for progression, natural history and treatment options for diabetic retinopathy  were discussed with patient.   - The need for close monitoring of blood glucose, blood pressure, and serum lipids, avoiding cigarette or any type of tobacco, and the need for long term  follow up was also discussed with patient. - monitor  6. Asteroid hyalosis OS  - discussed diagnosis, prognosis and benign nature  - monitor  7,8. Hypertensive retinopathy OU - discussed importance of tight BP control - monitor  9. Pseudophakia OU  - s/p CE/IOL OU (Dr. Waylan)  - IOLs in good position - monitor  Ophthalmic Meds Ordered this visit:  No orders of the defined types were placed in this encounter.     Return for f/u 9-12 months, ERM OS, DFE, OCT.  There are no Patient Instructions on file for this visit.  This document serves as a record of services personally performed by Redell JUDITHANN Hans, MD, PhD. It was created on their behalf by Almetta Pesa, an ophthalmic technician. The creation of this record is the provider's dictation and/or activities during the visit.    Electronically signed by: Almetta Pesa, OA, 07/15/23  11:58 AM  This document serves as a record of services personally performed by Redell JUDITHANN Hans, MD, PhD. It was created on their behalf by Alan PARAS. Delores, OA an ophthalmic technician. The creation of this record is the provider's dictation and/or activities during the visit.    Electronically signed by: Alan PARAS. Delores, OA 07/15/23 11:58 AM   Redell JUDITHANN Hans, M.D., Ph.D. Diseases & Surgery of the Retina and Vitreous [facility]  I have reviewed the above documentation for accuracy and completeness, and I agree with the above. Redell JUDITHANN Hans, M.D., Ph.D. 07/15/23 11:59 AM   Abbreviations: M myopia (nearsighted); A astigmatism; H hyperopia (farsighted); P presbyopia; Mrx spectacle prescription;  CTL contact lenses; OD right eye; OS left eye; OU both eyes  XT exotropia; ET esotropia; PEK punctate epithelial keratitis; PEE punctate epithelial erosions; DES dry eye syndrome; MGD meibomian gland dysfunction; ATs artificial tears; PFAT's preservative free artificial tears; NSC nuclear sclerotic cataract; PSC posterior  subcapsular cataract; ERM epi-retinal membrane; PVD posterior vitreous detachment; RD retinal detachment; DM diabetes mellitus; DR diabetic retinopathy; NPDR non-proliferative diabetic retinopathy; PDR proliferative diabetic retinopathy; CSME clinically significant macular edema; DME diabetic macular edema; dbh dot blot hemorrhages; CWS cotton wool spot; POAG primary open angle glaucoma; C/D cup-to-disc ratio; HVF humphrey visual field; GVF goldmann visual field; OCT optical coherence tomography; IOP intraocular pressure; BRVO Branch retinal vein occlusion; CRVO central retinal vein occlusion; CRAO central retinal artery occlusion; BRAO branch retinal artery occlusion; RT retinal tear; SB scleral buckle; PPV pars plana vitrectomy; VH Vitreous hemorrhage; PRP panretinal laser photocoagulation; IVK intravitreal kenalog; VMT vitreomacular traction; MH Macular hole;  NVD neovascularization of the disc; NVE neovascularization elsewhere; AREDS age related eye disease study; ARMD age related macular degeneration; POAG primary open angle glaucoma; EBMD epithelial/anterior basement membrane dystrophy; ACIOL anterior chamber intraocular lens; IOL intraocular lens; PCIOL posterior chamber intraocular lens; Phaco/IOL phacoemulsification with intraocular lens placement; PRK photorefractive keratectomy;  LASIK laser assisted in situ keratomileusis; HTN hypertension; DM diabetes mellitus; COPD chronic obstructive pulmonary disease

## 2023-07-11 ENCOUNTER — Ambulatory Visit (INDEPENDENT_AMBULATORY_CARE_PROVIDER_SITE_OTHER): Admitting: Ophthalmology

## 2023-07-11 ENCOUNTER — Encounter (INDEPENDENT_AMBULATORY_CARE_PROVIDER_SITE_OTHER): Payer: Self-pay | Admitting: Ophthalmology

## 2023-07-11 DIAGNOSIS — E119 Type 2 diabetes mellitus without complications: Secondary | ICD-10-CM | POA: Diagnosis not present

## 2023-07-11 DIAGNOSIS — H4322 Crystalline deposits in vitreous body, left eye: Secondary | ICD-10-CM

## 2023-07-11 DIAGNOSIS — H35033 Hypertensive retinopathy, bilateral: Secondary | ICD-10-CM | POA: Diagnosis not present

## 2023-07-11 DIAGNOSIS — H35342 Macular cyst, hole, or pseudohole, left eye: Secondary | ICD-10-CM | POA: Diagnosis not present

## 2023-07-11 DIAGNOSIS — H35372 Puckering of macula, left eye: Secondary | ICD-10-CM

## 2023-07-11 DIAGNOSIS — Z7984 Long term (current) use of oral hypoglycemic drugs: Secondary | ICD-10-CM | POA: Diagnosis not present

## 2023-07-11 DIAGNOSIS — H35352 Cystoid macular degeneration, left eye: Secondary | ICD-10-CM

## 2023-07-11 DIAGNOSIS — I10 Essential (primary) hypertension: Secondary | ICD-10-CM

## 2023-07-11 DIAGNOSIS — Z961 Presence of intraocular lens: Secondary | ICD-10-CM | POA: Diagnosis not present

## 2023-07-15 ENCOUNTER — Encounter (INDEPENDENT_AMBULATORY_CARE_PROVIDER_SITE_OTHER): Payer: Self-pay | Admitting: Ophthalmology

## 2023-07-30 DIAGNOSIS — R978 Other abnormal tumor markers: Secondary | ICD-10-CM | POA: Diagnosis not present

## 2023-07-30 DIAGNOSIS — D137 Benign neoplasm of endocrine pancreas: Secondary | ICD-10-CM | POA: Diagnosis not present

## 2023-07-30 DIAGNOSIS — C7A8 Other malignant neuroendocrine tumors: Secondary | ICD-10-CM | POA: Diagnosis not present

## 2023-07-30 DIAGNOSIS — R7309 Other abnormal glucose: Secondary | ICD-10-CM | POA: Diagnosis not present

## 2023-07-30 DIAGNOSIS — D3A8 Other benign neuroendocrine tumors: Secondary | ICD-10-CM | POA: Diagnosis not present

## 2023-07-30 DIAGNOSIS — C254 Malignant neoplasm of endocrine pancreas: Secondary | ICD-10-CM | POA: Diagnosis not present

## 2023-07-30 DIAGNOSIS — Z789 Other specified health status: Secondary | ICD-10-CM | POA: Diagnosis not present

## 2023-07-30 DIAGNOSIS — C259 Malignant neoplasm of pancreas, unspecified: Secondary | ICD-10-CM | POA: Diagnosis not present

## 2023-08-30 DIAGNOSIS — H04123 Dry eye syndrome of bilateral lacrimal glands: Secondary | ICD-10-CM | POA: Diagnosis not present

## 2023-09-05 DIAGNOSIS — Z6831 Body mass index (BMI) 31.0-31.9, adult: Secondary | ICD-10-CM | POA: Diagnosis not present

## 2023-09-05 DIAGNOSIS — L258 Unspecified contact dermatitis due to other agents: Secondary | ICD-10-CM | POA: Diagnosis not present

## 2023-09-14 DIAGNOSIS — H04123 Dry eye syndrome of bilateral lacrimal glands: Secondary | ICD-10-CM | POA: Diagnosis not present

## 2023-09-14 DIAGNOSIS — H40053 Ocular hypertension, bilateral: Secondary | ICD-10-CM | POA: Diagnosis not present

## 2023-10-05 DIAGNOSIS — Z23 Encounter for immunization: Secondary | ICD-10-CM | POA: Diagnosis not present

## 2023-10-22 ENCOUNTER — Other Ambulatory Visit (INDEPENDENT_AMBULATORY_CARE_PROVIDER_SITE_OTHER): Payer: Self-pay | Admitting: Ophthalmology

## 2023-10-23 ENCOUNTER — Other Ambulatory Visit (INDEPENDENT_AMBULATORY_CARE_PROVIDER_SITE_OTHER): Payer: Self-pay

## 2023-10-23 MED ORDER — BRIMONIDINE TARTRATE 0.2 % OP SOLN: 1.0000 [drp] | Freq: Two times a day (BID) | OPHTHALMIC | 3 refills | Status: AC

## 2023-11-23 DIAGNOSIS — H40053 Ocular hypertension, bilateral: Secondary | ICD-10-CM | POA: Diagnosis not present

## 2023-12-28 DIAGNOSIS — E119 Type 2 diabetes mellitus without complications: Secondary | ICD-10-CM | POA: Diagnosis not present

## 2024-05-12 ENCOUNTER — Encounter (INDEPENDENT_AMBULATORY_CARE_PROVIDER_SITE_OTHER): Admitting: Ophthalmology
